# Patient Record
Sex: Female | Born: 1935
Health system: Southern US, Community
[De-identification: ages and names within clinical notes are randomized; demographics above are authoritative.]

## PROBLEM LIST (undated history)

## (undated) DIAGNOSIS — G479 Sleep disorder, unspecified: Secondary | ICD-10-CM

## (undated) DIAGNOSIS — M1990 Unspecified osteoarthritis, unspecified site: Secondary | ICD-10-CM

## (undated) DIAGNOSIS — K219 Gastro-esophageal reflux disease without esophagitis: Secondary | ICD-10-CM

## (undated) DIAGNOSIS — C4490 Unspecified malignant neoplasm of skin, unspecified: Secondary | ICD-10-CM

## (undated) DIAGNOSIS — E785 Hyperlipidemia, unspecified: Secondary | ICD-10-CM

## (undated) DIAGNOSIS — K5790 Diverticulosis of intestine, part unspecified, without perforation or abscess without bleeding: Secondary | ICD-10-CM

## (undated) DIAGNOSIS — J302 Other seasonal allergic rhinitis: Secondary | ICD-10-CM

## (undated) DIAGNOSIS — T8859XA Other complications of anesthesia, initial encounter: Secondary | ICD-10-CM

## (undated) DIAGNOSIS — M8580 Other specified disorders of bone density and structure, unspecified site: Secondary | ICD-10-CM

## (undated) DIAGNOSIS — T4145XA Adverse effect of unspecified anesthetic, initial encounter: Secondary | ICD-10-CM

## (undated) DIAGNOSIS — I10 Essential (primary) hypertension: Secondary | ICD-10-CM

## (undated) HISTORY — PX: CATARACT EXTRACTION W/ INTRAOCULAR LENS IMPLANT: SHX1309

## (undated) HISTORY — PX: SKIN CANCER EXCISION: SHX779

## (undated) HISTORY — PX: ABDOMINAL HYSTERECTOMY: SHX81

---

## 1989-05-14 HISTORY — PX: BILATERAL OOPHORECTOMY: SHX1221

## 1999-03-14 ENCOUNTER — Encounter: Payer: Self-pay | Admitting: Family Medicine

## 1999-03-14 ENCOUNTER — Encounter: Admission: RE | Admit: 1999-03-14 | Discharge: 1999-03-14 | Payer: Self-pay | Admitting: Family Medicine

## 2000-03-14 ENCOUNTER — Encounter: Payer: Self-pay | Admitting: Family Medicine

## 2000-03-14 ENCOUNTER — Encounter: Admission: RE | Admit: 2000-03-14 | Discharge: 2000-03-14 | Payer: Self-pay | Admitting: Family Medicine

## 2001-03-17 ENCOUNTER — Encounter: Payer: Self-pay | Admitting: Family Medicine

## 2001-03-17 ENCOUNTER — Encounter: Admission: RE | Admit: 2001-03-17 | Discharge: 2001-03-17 | Payer: Self-pay | Admitting: Family Medicine

## 2002-01-30 ENCOUNTER — Other Ambulatory Visit: Admission: RE | Admit: 2002-01-30 | Discharge: 2002-01-30 | Payer: Self-pay | Admitting: Family Medicine

## 2002-03-18 ENCOUNTER — Encounter: Payer: Self-pay | Admitting: Family Medicine

## 2002-03-18 ENCOUNTER — Encounter: Admission: RE | Admit: 2002-03-18 | Discharge: 2002-03-18 | Payer: Self-pay | Admitting: Family Medicine

## 2002-08-03 ENCOUNTER — Encounter: Payer: Self-pay | Admitting: Family Medicine

## 2002-08-03 ENCOUNTER — Encounter: Admission: RE | Admit: 2002-08-03 | Discharge: 2002-08-03 | Payer: Self-pay | Admitting: Family Medicine

## 2003-03-31 ENCOUNTER — Encounter: Admission: RE | Admit: 2003-03-31 | Discharge: 2003-03-31 | Payer: Self-pay | Admitting: Family Medicine

## 2003-04-23 ENCOUNTER — Ambulatory Visit (HOSPITAL_COMMUNITY): Admission: RE | Admit: 2003-04-23 | Discharge: 2003-04-23 | Payer: Self-pay | Admitting: Gastroenterology

## 2004-04-14 ENCOUNTER — Encounter: Admission: RE | Admit: 2004-04-14 | Discharge: 2004-04-14 | Payer: Self-pay | Admitting: Family Medicine

## 2005-04-27 ENCOUNTER — Encounter: Admission: RE | Admit: 2005-04-27 | Discharge: 2005-04-27 | Payer: Self-pay | Admitting: Family Medicine

## 2006-04-30 ENCOUNTER — Encounter: Admission: RE | Admit: 2006-04-30 | Discharge: 2006-04-30 | Payer: Self-pay | Admitting: Family Medicine

## 2007-05-12 ENCOUNTER — Encounter: Admission: RE | Admit: 2007-05-12 | Discharge: 2007-05-12 | Payer: Self-pay | Admitting: Family Medicine

## 2008-05-25 ENCOUNTER — Encounter: Admission: RE | Admit: 2008-05-25 | Discharge: 2008-05-25 | Payer: Self-pay | Admitting: Family Medicine

## 2009-05-30 ENCOUNTER — Encounter: Admission: RE | Admit: 2009-05-30 | Discharge: 2009-05-30 | Payer: Self-pay | Admitting: Family Medicine

## 2009-09-19 ENCOUNTER — Emergency Department (HOSPITAL_COMMUNITY)
Admission: EM | Admit: 2009-09-19 | Discharge: 2009-09-19 | Payer: Self-pay | Source: Home / Self Care | Admitting: Emergency Medicine

## 2010-08-14 ENCOUNTER — Other Ambulatory Visit: Payer: Self-pay | Admitting: Family Medicine

## 2010-08-14 DIAGNOSIS — Z1231 Encounter for screening mammogram for malignant neoplasm of breast: Secondary | ICD-10-CM

## 2010-08-29 ENCOUNTER — Ambulatory Visit
Admission: RE | Admit: 2010-08-29 | Discharge: 2010-08-29 | Disposition: A | Discharge status: Home / Self Care | Payer: Medicare Other | Source: Ambulatory Visit | Attending: Family Medicine | Admitting: Family Medicine

## 2010-08-29 DIAGNOSIS — Z1231 Encounter for screening mammogram for malignant neoplasm of breast: Secondary | ICD-10-CM

## 2011-07-31 ENCOUNTER — Other Ambulatory Visit: Payer: Self-pay | Admitting: Family Medicine

## 2011-07-31 DIAGNOSIS — Z1231 Encounter for screening mammogram for malignant neoplasm of breast: Secondary | ICD-10-CM

## 2011-09-04 ENCOUNTER — Ambulatory Visit
Admission: RE | Admit: 2011-09-04 | Discharge: 2011-09-04 | Disposition: A | Discharge status: Home / Self Care | Payer: BLUE CROSS/BLUE SHIELD | Source: Ambulatory Visit | Attending: Family Medicine | Admitting: Family Medicine

## 2011-09-04 DIAGNOSIS — Z1231 Encounter for screening mammogram for malignant neoplasm of breast: Secondary | ICD-10-CM

## 2011-10-04 DIAGNOSIS — M949 Disorder of cartilage, unspecified: Secondary | ICD-10-CM | POA: Diagnosis not present

## 2011-10-04 DIAGNOSIS — R42 Dizziness and giddiness: Secondary | ICD-10-CM | POA: Diagnosis not present

## 2011-10-04 DIAGNOSIS — I10 Essential (primary) hypertension: Secondary | ICD-10-CM | POA: Diagnosis not present

## 2011-10-04 DIAGNOSIS — Z1211 Encounter for screening for malignant neoplasm of colon: Secondary | ICD-10-CM | POA: Diagnosis not present

## 2011-10-04 DIAGNOSIS — E782 Mixed hyperlipidemia: Secondary | ICD-10-CM | POA: Diagnosis not present

## 2011-10-04 DIAGNOSIS — N9089 Other specified noninflammatory disorders of vulva and perineum: Secondary | ICD-10-CM | POA: Diagnosis not present

## 2011-10-04 DIAGNOSIS — Z0000 Encounter for general adult medical examination without abnormal findings: Secondary | ICD-10-CM | POA: Diagnosis not present

## 2011-10-04 DIAGNOSIS — M899 Disorder of bone, unspecified: Secondary | ICD-10-CM | POA: Diagnosis not present

## 2011-10-04 DIAGNOSIS — Z23 Encounter for immunization: Secondary | ICD-10-CM | POA: Diagnosis not present

## 2011-10-11 DIAGNOSIS — Z1211 Encounter for screening for malignant neoplasm of colon: Secondary | ICD-10-CM | POA: Diagnosis not present

## 2012-04-07 DIAGNOSIS — E782 Mixed hyperlipidemia: Secondary | ICD-10-CM | POA: Diagnosis not present

## 2012-08-27 ENCOUNTER — Other Ambulatory Visit: Payer: Self-pay

## 2012-08-27 DIAGNOSIS — Z1231 Encounter for screening mammogram for malignant neoplasm of breast: Secondary | ICD-10-CM

## 2012-10-07 ENCOUNTER — Ambulatory Visit
Admission: RE | Admit: 2012-10-07 | Discharge: 2012-10-07 | Disposition: A | Discharge status: Home / Self Care | Payer: Medicare Other | Source: Ambulatory Visit

## 2012-10-07 DIAGNOSIS — Z1231 Encounter for screening mammogram for malignant neoplasm of breast: Secondary | ICD-10-CM | POA: Diagnosis not present

## 2012-10-08 ENCOUNTER — Other Ambulatory Visit: Payer: Self-pay | Admitting: Family Medicine

## 2012-10-08 DIAGNOSIS — R928 Other abnormal and inconclusive findings on diagnostic imaging of breast: Secondary | ICD-10-CM

## 2012-10-16 ENCOUNTER — Ambulatory Visit
Admission: RE | Admit: 2012-10-16 | Discharge: 2012-10-16 | Disposition: A | Discharge status: Home / Self Care | Payer: Medicare Other | Source: Ambulatory Visit | Attending: Family Medicine | Admitting: Family Medicine

## 2012-10-16 DIAGNOSIS — R928 Other abnormal and inconclusive findings on diagnostic imaging of breast: Secondary | ICD-10-CM

## 2012-11-28 DIAGNOSIS — Z1331 Encounter for screening for depression: Secondary | ICD-10-CM | POA: Diagnosis not present

## 2012-11-28 DIAGNOSIS — M899 Disorder of bone, unspecified: Secondary | ICD-10-CM | POA: Diagnosis not present

## 2012-11-28 DIAGNOSIS — E782 Mixed hyperlipidemia: Secondary | ICD-10-CM | POA: Diagnosis not present

## 2012-11-28 DIAGNOSIS — I10 Essential (primary) hypertension: Secondary | ICD-10-CM | POA: Diagnosis not present

## 2012-11-28 DIAGNOSIS — Z0000 Encounter for general adult medical examination without abnormal findings: Secondary | ICD-10-CM | POA: Diagnosis not present

## 2012-11-28 DIAGNOSIS — Z1211 Encounter for screening for malignant neoplasm of colon: Secondary | ICD-10-CM | POA: Diagnosis not present

## 2012-11-28 DIAGNOSIS — R42 Dizziness and giddiness: Secondary | ICD-10-CM | POA: Diagnosis not present

## 2012-12-30 DIAGNOSIS — M899 Disorder of bone, unspecified: Secondary | ICD-10-CM | POA: Diagnosis not present

## 2013-02-19 DIAGNOSIS — L821 Other seborrheic keratosis: Secondary | ICD-10-CM | POA: Diagnosis not present

## 2013-02-19 DIAGNOSIS — L570 Actinic keratosis: Secondary | ICD-10-CM | POA: Diagnosis not present

## 2013-02-19 DIAGNOSIS — L989 Disorder of the skin and subcutaneous tissue, unspecified: Secondary | ICD-10-CM | POA: Diagnosis not present

## 2013-02-19 DIAGNOSIS — Z808 Family history of malignant neoplasm of other organs or systems: Secondary | ICD-10-CM | POA: Diagnosis not present

## 2013-03-09 ENCOUNTER — Other Ambulatory Visit: Payer: Self-pay | Admitting: Family Medicine

## 2013-03-09 DIAGNOSIS — R921 Mammographic calcification found on diagnostic imaging of breast: Secondary | ICD-10-CM

## 2013-04-14 ENCOUNTER — Ambulatory Visit
Admission: RE | Admit: 2013-04-14 | Discharge: 2013-04-14 | Disposition: A | Discharge status: Home / Self Care | Payer: Medicare Other | Source: Ambulatory Visit | Attending: Family Medicine | Admitting: Family Medicine

## 2013-04-14 DIAGNOSIS — R921 Mammographic calcification found on diagnostic imaging of breast: Secondary | ICD-10-CM

## 2013-04-14 DIAGNOSIS — R920 Mammographic microcalcification found on diagnostic imaging of breast: Secondary | ICD-10-CM | POA: Diagnosis not present

## 2013-05-11 DIAGNOSIS — K648 Other hemorrhoids: Secondary | ICD-10-CM | POA: Diagnosis not present

## 2013-05-11 DIAGNOSIS — Z1211 Encounter for screening for malignant neoplasm of colon: Secondary | ICD-10-CM | POA: Diagnosis not present

## 2013-05-11 DIAGNOSIS — K5730 Diverticulosis of large intestine without perforation or abscess without bleeding: Secondary | ICD-10-CM | POA: Diagnosis not present

## 2013-06-24 DIAGNOSIS — J029 Acute pharyngitis, unspecified: Secondary | ICD-10-CM | POA: Diagnosis not present

## 2013-06-24 DIAGNOSIS — J111 Influenza due to unidentified influenza virus with other respiratory manifestations: Secondary | ICD-10-CM | POA: Diagnosis not present

## 2013-09-09 ENCOUNTER — Other Ambulatory Visit: Payer: Self-pay | Admitting: Family Medicine

## 2013-09-09 DIAGNOSIS — R921 Mammographic calcification found on diagnostic imaging of breast: Secondary | ICD-10-CM

## 2013-10-20 ENCOUNTER — Ambulatory Visit
Admission: RE | Admit: 2013-10-20 | Discharge: 2013-10-20 | Disposition: A | Discharge status: Home / Self Care | Payer: Medicare Other | Source: Ambulatory Visit | Attending: Family Medicine | Admitting: Family Medicine

## 2013-10-20 DIAGNOSIS — H5210 Myopia, unspecified eye: Secondary | ICD-10-CM | POA: Diagnosis not present

## 2013-10-20 DIAGNOSIS — R928 Other abnormal and inconclusive findings on diagnostic imaging of breast: Secondary | ICD-10-CM | POA: Diagnosis not present

## 2013-10-20 DIAGNOSIS — H524 Presbyopia: Secondary | ICD-10-CM | POA: Diagnosis not present

## 2013-10-20 DIAGNOSIS — H25039 Anterior subcapsular polar age-related cataract, unspecified eye: Secondary | ICD-10-CM | POA: Diagnosis not present

## 2013-10-20 DIAGNOSIS — H52229 Regular astigmatism, unspecified eye: Secondary | ICD-10-CM | POA: Diagnosis not present

## 2013-10-20 DIAGNOSIS — R921 Mammographic calcification found on diagnostic imaging of breast: Secondary | ICD-10-CM

## 2013-12-15 ENCOUNTER — Ambulatory Visit
Admission: RE | Admit: 2013-12-15 | Discharge: 2013-12-15 | Disposition: A | Discharge status: Home / Self Care | Payer: Medicare Other | Source: Ambulatory Visit | Attending: Family Medicine | Admitting: Family Medicine

## 2013-12-15 ENCOUNTER — Other Ambulatory Visit: Payer: Self-pay | Admitting: Family Medicine

## 2013-12-15 DIAGNOSIS — M25551 Pain in right hip: Secondary | ICD-10-CM

## 2013-12-15 DIAGNOSIS — M79604 Pain in right leg: Secondary | ICD-10-CM

## 2013-12-15 DIAGNOSIS — M79609 Pain in unspecified limb: Secondary | ICD-10-CM | POA: Diagnosis not present

## 2013-12-15 DIAGNOSIS — M169 Osteoarthritis of hip, unspecified: Secondary | ICD-10-CM | POA: Diagnosis not present

## 2013-12-15 DIAGNOSIS — M1610 Unilateral primary osteoarthritis, unspecified hip: Secondary | ICD-10-CM | POA: Diagnosis not present

## 2014-01-06 DIAGNOSIS — M1610 Unilateral primary osteoarthritis, unspecified hip: Secondary | ICD-10-CM | POA: Diagnosis not present

## 2014-01-26 DIAGNOSIS — M1610 Unilateral primary osteoarthritis, unspecified hip: Secondary | ICD-10-CM | POA: Diagnosis not present

## 2014-03-16 DIAGNOSIS — Z23 Encounter for immunization: Secondary | ICD-10-CM | POA: Diagnosis not present

## 2014-03-16 DIAGNOSIS — R42 Dizziness and giddiness: Secondary | ICD-10-CM | POA: Diagnosis not present

## 2014-03-16 DIAGNOSIS — Z1389 Encounter for screening for other disorder: Secondary | ICD-10-CM | POA: Diagnosis not present

## 2014-03-16 DIAGNOSIS — K219 Gastro-esophageal reflux disease without esophagitis: Secondary | ICD-10-CM | POA: Diagnosis not present

## 2014-03-16 DIAGNOSIS — N183 Chronic kidney disease, stage 3 (moderate): Secondary | ICD-10-CM | POA: Diagnosis not present

## 2014-03-16 DIAGNOSIS — M8580 Other specified disorders of bone density and structure, unspecified site: Secondary | ICD-10-CM | POA: Diagnosis not present

## 2014-03-16 DIAGNOSIS — Z0000 Encounter for general adult medical examination without abnormal findings: Secondary | ICD-10-CM | POA: Diagnosis not present

## 2014-03-16 DIAGNOSIS — J301 Allergic rhinitis due to pollen: Secondary | ICD-10-CM | POA: Diagnosis not present

## 2014-03-16 DIAGNOSIS — E782 Mixed hyperlipidemia: Secondary | ICD-10-CM | POA: Diagnosis not present

## 2014-03-16 DIAGNOSIS — I129 Hypertensive chronic kidney disease with stage 1 through stage 4 chronic kidney disease, or unspecified chronic kidney disease: Secondary | ICD-10-CM | POA: Diagnosis not present

## 2014-03-18 DIAGNOSIS — M1611 Unilateral primary osteoarthritis, right hip: Secondary | ICD-10-CM | POA: Diagnosis not present

## 2014-03-24 DIAGNOSIS — D1801 Hemangioma of skin and subcutaneous tissue: Secondary | ICD-10-CM | POA: Diagnosis not present

## 2014-03-24 DIAGNOSIS — L814 Other melanin hyperpigmentation: Secondary | ICD-10-CM | POA: Diagnosis not present

## 2014-03-24 DIAGNOSIS — L821 Other seborrheic keratosis: Secondary | ICD-10-CM | POA: Diagnosis not present

## 2014-03-24 DIAGNOSIS — D239 Other benign neoplasm of skin, unspecified: Secondary | ICD-10-CM | POA: Diagnosis not present

## 2014-03-24 DIAGNOSIS — Z808 Family history of malignant neoplasm of other organs or systems: Secondary | ICD-10-CM | POA: Diagnosis not present

## 2014-06-03 ENCOUNTER — Ambulatory Visit: Payer: Self-pay | Admitting: Orthopedic Surgery

## 2014-06-03 NOTE — Progress Notes (Signed)
Preoperative surgical orders have been place into the [facility] hospital system for Desiree Caldwell on 06/03/2014, 5:35 PM  by Mickel Crow for surgery on 06-21-2014.  Preop Total Hip - Anterior Approach orders including Experel Injecion, IV Tylenol, and IV Decadron as long as there are no contraindications to the above medications. Arlee Muslim, PA-C

## 2014-06-11 NOTE — Patient Instructions (Addendum)
Desiree Caldwell  06/11/2014   Your procedure is scheduled on:  06/21/14    Report to [facility] Main  Entrance and follow signs to               Desiree Caldwell at 11:00 AM.   Call this number if you have problems the morning of surgery [phone]   Remember:  Do not eat food  :After Midnight.                         MAY HAVE CLEAR LIQUIDS UNTIL 8:00 AM    CLEAR LIQUID DIET   Foods Allowed                                                                     Foods Excluded  Coffee and tea, regular and decaf                             liquids that you cannot  Plain Jell-O in any flavor                                             see through such as: Fruit ices (not with fruit pulp)                                     milk, soups, orange juice  Iced Popsicles                                                   All solid food Carbonated beverages, regular and diet                                    Cranberry, grape and apple juices Sports drinks like Gatorade Lightly seasoned clear broth or consume(fat free) Sugar, honey syrup _____________________________________________________________________    Take these medicines the morning of surgery with A SIP OF WATER: PRILOSEC / FLONASE NASAL SPRAY                               You may not have any metal on your body including hair pins and              piercings  Do not wear jewelry, make-up, lotions, powders or perfumes.             Do not wear nail polish.  Do not shave  48 hours prior to surgery.              Men may shave face and neck.   Do not bring valuables to the hospital. Desiree Caldwell IS NOT  RESPONSIBLE   FOR VALUABLES.  Contacts, dentures or bridgework may not be worn into surgery.  Leave suitcase in the car. After surgery it may be brought to your room.     Patients discharged the day of surgery will not be allowed to drive home.  Name and phone number of your driver:  Special  Instructions: N/A              Please read over the following fact sheets you were given: _____________________________________________________________________                                                     Desiree Caldwell  Before surgery, you can play an important role.  Because skin is not sterile, your skin needs to be as free of germs as possible.  You can reduce the number of germs on your skin by washing with CHG (chlorahexidine gluconate) soap before surgery.  CHG is an antiseptic cleaner which kills germs and bonds with the skin to continue killing germs even after washing. Please DO NOT use if you have an allergy to CHG or antibacterial soaps.  If your skin becomes reddened/irritated stop using the CHG and inform your nurse when you arrive at Short Stay. Do not shave (including legs and underarms) for at least 48 hours prior to the first CHG shower.  You may shave your face. Please follow these instructions carefully:   1.  Shower with CHG Soap the night before surgery and the  morning of Surgery.   2.  If you choose to wash your hair, wash your hair first as usual with your  normal  Shampoo.   3.  After you shampoo, rinse your hair and body thoroughly to remove the  shampoo.                                         4.  Use CHG as you would any other liquid soap.  You can apply chg directly  to the skin and wash . Gently wash with scrungie or clean wascloth    5.  Apply the CHG Soap to your body ONLY FROM THE NECK DOWN.   Do not use on open                           Wound or open sores. Avoid contact with eyes, ears mouth and genitals (private parts).                        Genitals (private parts) with your normal soap.              6.  Wash thoroughly, paying special attention to the area where your surgery  will be performed.   7.  Thoroughly rinse your body with warm water from the neck down.   8.  DO NOT shower/wash with your normal soap after  using and rinsing off  the CHG Soap .                9.  Pat yourself dry with a clean towel.  10.  Wear clean pajamas.             11.  Place clean sheets on your bed the night of your first shower and do not  sleep with pets.  Day of Surgery : Do not apply any lotions/deodorants the morning of surgery.  Please wear clean clothes to the hospital/surgery center.  FAILURE TO FOLLOW THESE INSTRUCTIONS MAY RESULT IN THE CANCELLATION OF YOUR SURGERY    PATIENT SIGNATURE_________________________________  ______________________________________________________________________     Desiree Caldwell  An incentive spirometer is a tool that can help keep your lungs clear and active. This tool measures how well you are filling your lungs with each breath. Taking long deep breaths may help reverse or decrease the chance of developing breathing (pulmonary) problems (especially infection) following:  A long period of time when you are unable to move or be active. BEFORE THE PROCEDURE   If the spirometer includes an indicator to show your best effort, your nurse or respiratory therapist will set it to a desired goal.  If possible, sit up straight or lean slightly forward. Try not to slouch.  Hold the incentive spirometer in an upright position. INSTRUCTIONS FOR USE   Sit on the edge of your bed if possible, or sit up as far as you can in bed or on a chair.  Hold the incentive spirometer in an upright position.  Breathe out normally.  Place the mouthpiece in your mouth and seal your lips tightly around it.  Breathe in slowly and as deeply as possible, raising the piston or the ball toward the top of the column.  Hold your breath for 3-5 seconds or for as long as possible. Allow the piston or ball to fall to the bottom of the column.  Remove the mouthpiece from your mouth and breathe out normally.  Rest for a few seconds and repeat Steps 1 through 7 at least 10 times  every 1-2 hours when you are awake. Take your time and take a few normal breaths between deep breaths.  The spirometer may include an indicator to show your best effort. Use the indicator as a goal to work toward during each repetition.  After each set of 10 deep breaths, practice coughing to be sure your lungs are clear. If you have an incision (the cut made at the time of surgery), support your incision when coughing by placing a pillow or rolled up towels firmly against it. Once you are able to get out of bed, walk around indoors and cough well. You may stop using the incentive spirometer when instructed by your caregiver.  RISKS AND COMPLICATIONS  Take your time so you do not get dizzy or light-headed.  If you are in pain, you may need to take or ask for pain medication before doing incentive spirometry. It is harder to take a deep breath if you are having pain. AFTER USE  Rest and breathe slowly and easily.  It can be helpful to keep track of a log of your progress. Your caregiver can provide you with a simple table to help with this. If you are using the spirometer at home, follow these instructions: [place] IF:   You are having difficultly using the spirometer.  You have trouble using the spirometer as often as instructed.  Your pain medication is not giving enough relief while using the spirometer.  You develop fever of 100.5 F (38.1 C) or higher. SEEK IMMEDIATE MEDICAL CARE IF:   You  cough up bloody sputum that had not been present before.  You develop fever of 102 F (38.9 C) or greater.  You develop worsening pain at or near the incision site. MAKE SURE YOU:   Understand these instructions.  Will watch your condition.  Will get help right away if you are not doing well or get worse. Document Released: 09/10/2006 Document Revised: 07/23/2011 Document Reviewed: 11/11/2006 Exit[employer] Patient Information 2014 ExitCare,  [place].   ________________________________________________________________________  WHAT IS A BLOOD TRANSFUSION? Blood Transfusion Information  A transfusion is the replacement of blood or some of its parts. Blood is made up of multiple cells which provide different functions.  Red blood cells carry oxygen and are used for blood loss replacement.  White blood cells fight against infection.  Platelets control bleeding.  Plasma helps clot blood.  Other blood products are available for specialized needs, such as hemophilia or other clotting disorders. BEFORE THE TRANSFUSION  Who gives blood for transfusions?   Healthy volunteers who are fully evaluated to make sure their blood is safe. This is blood bank blood. Transfusion therapy is the safest it has ever been in the practice of medicine. Before blood is taken from a donor, a complete history is taken to make sure that person has no history of diseases nor engages in risky social behavior (examples are intravenous drug use or sexual activity with multiple partners). The donor's travel history is screened to minimize risk of transmitting infections, such as malaria. The donated blood is tested for signs of infectious diseases, such as HIV and hepatitis. The blood is then tested to be sure it is compatible with you in order to minimize the chance of a transfusion reaction. If you or a relative donates blood, this is often done in anticipation of surgery and is not appropriate for emergency situations. It takes many days to process the donated blood. RISKS AND COMPLICATIONS Although transfusion therapy is very safe and saves many lives, the main dangers of transfusion include:   Getting an infectious disease.  Developing a transfusion reaction. This is an allergic reaction to something in the blood you were given. Every precaution is taken to prevent this. The decision to have a blood transfusion has been considered carefully by your caregiver  before blood is given. Blood is not given unless the benefits outweigh the risks. AFTER THE TRANSFUSION  Right after receiving a blood transfusion, you will usually feel much better and more energetic. This is especially true if your red blood cells have gotten low (anemic). The transfusion raises the level of the red blood cells which carry oxygen, and this usually causes an energy increase.  The nurse administering the transfusion will monitor you carefully for complications. HOME CARE INSTRUCTIONS  No special instructions are needed after a transfusion. You may find your energy is better. Speak with your caregiver about any limitations on activity for underlying diseases you may have. SEEK MEDICAL CARE IF:   Your condition is not improving after your transfusion.  You develop redness or irritation at the intravenous (IV) site. SEEK IMMEDIATE MEDICAL CARE IF:  Any of the following symptoms occur over the next 12 hours:  Shaking chills.  You have a temperature by mouth above 102 F (38.9 C), not controlled by medicine.  Chest, back, or muscle pain.  People around you feel you are not acting correctly or are confused.  Shortness of breath or difficulty breathing.  Dizziness and fainting.  You get a rash or develop hives.  You have a decrease in urine output.  Your urine turns a dark color or changes to pink, red, or brown. Any of the following symptoms occur over the next 10 days:  You have a temperature by mouth above 102 F (38.9 C), not controlled by medicine.  Shortness of breath.  Weakness after normal activity.  The white part of the eye turns yellow (jaundice).  You have a decrease in the amount of urine or are urinating less often.  Your urine turns a dark color or changes to pink, red, or brown. Document Released: 04/27/2000 Document Revised: 07/23/2011 Document Reviewed: 12/15/2007 [facility] Patient Information 2014 [place],  [place].  _______________________________________________________________________

## 2014-06-14 ENCOUNTER — Ambulatory Visit (HOSPITAL_COMMUNITY)
Admission: RE | Admit: 2014-06-14 | Discharge: 2014-06-14 | Disposition: A | Discharge status: Home / Self Care | Payer: Medicare Other | Source: Ambulatory Visit | Attending: Anesthesiology | Admitting: Anesthesiology

## 2014-06-14 ENCOUNTER — Other Ambulatory Visit: Payer: Self-pay

## 2014-06-14 ENCOUNTER — Encounter (HOSPITAL_COMMUNITY)
Admission: RE | Admit: 2014-06-14 | Discharge: 2014-06-14 | Disposition: A | Discharge status: Home / Self Care | Payer: Medicare Other | Source: Ambulatory Visit | Attending: Orthopedic Surgery | Admitting: Orthopedic Surgery

## 2014-06-14 ENCOUNTER — Encounter (HOSPITAL_COMMUNITY): Payer: Self-pay

## 2014-06-14 DIAGNOSIS — E780 Pure hypercholesterolemia: Secondary | ICD-10-CM | POA: Diagnosis not present

## 2014-06-14 DIAGNOSIS — Z0183 Encounter for blood typing: Secondary | ICD-10-CM | POA: Insufficient documentation

## 2014-06-14 DIAGNOSIS — M1611 Unilateral primary osteoarthritis, right hip: Secondary | ICD-10-CM | POA: Diagnosis not present

## 2014-06-14 DIAGNOSIS — Z01812 Encounter for preprocedural laboratory examination: Secondary | ICD-10-CM | POA: Diagnosis not present

## 2014-06-14 DIAGNOSIS — K449 Diaphragmatic hernia without obstruction or gangrene: Secondary | ICD-10-CM | POA: Diagnosis not present

## 2014-06-14 DIAGNOSIS — Z7982 Long term (current) use of aspirin: Secondary | ICD-10-CM | POA: Diagnosis not present

## 2014-06-14 DIAGNOSIS — I10 Essential (primary) hypertension: Secondary | ICD-10-CM | POA: Diagnosis not present

## 2014-06-14 DIAGNOSIS — Z79899 Other long term (current) drug therapy: Secondary | ICD-10-CM | POA: Diagnosis not present

## 2014-06-14 DIAGNOSIS — Z01818 Encounter for other preprocedural examination: Secondary | ICD-10-CM | POA: Insufficient documentation

## 2014-06-14 HISTORY — DX: Gastro-esophageal reflux disease without esophagitis: K21.9

## 2014-06-14 HISTORY — DX: Other complications of anesthesia, initial encounter: T88.59XA

## 2014-06-14 HISTORY — DX: Other seasonal allergic rhinitis: J30.2

## 2014-06-14 HISTORY — DX: Essential (primary) hypertension: I10

## 2014-06-14 HISTORY — DX: Sleep disorder, unspecified: G47.9

## 2014-06-14 HISTORY — DX: Hyperlipidemia, unspecified: E78.5

## 2014-06-14 HISTORY — DX: Other specified disorders of bone density and structure, unspecified site: M85.80

## 2014-06-14 HISTORY — DX: Diverticulosis of intestine, part unspecified, without perforation or abscess without bleeding: K57.90

## 2014-06-14 HISTORY — DX: Adverse effect of unspecified anesthetic, initial encounter: T41.45XA

## 2014-06-14 HISTORY — DX: Unspecified osteoarthritis, unspecified site: M19.90

## 2014-06-14 LAB — URINALYSIS, ROUTINE W REFLEX MICROSCOPIC
Bilirubin Urine: NEGATIVE
GLUCOSE, UA: NEGATIVE mg/dL
Hgb urine dipstick: NEGATIVE
KETONES UR: NEGATIVE mg/dL
LEUKOCYTES UA: NEGATIVE
NITRITE: NEGATIVE
PROTEIN: NEGATIVE mg/dL
Specific Gravity, Urine: 1.017 (ref 1.005–1.030)
UROBILINOGEN UA: 0.2 mg/dL (ref 0.0–1.0)
pH: 5 (ref 5.0–8.0)

## 2014-06-14 LAB — COMPREHENSIVE METABOLIC PANEL
ALT: 16 U/L (ref 0–35)
ANION GAP: 8 (ref 5–15)
AST: 20 U/L (ref 0–37)
Albumin: 4.2 g/dL (ref 3.5–5.2)
Alkaline Phosphatase: 64 U/L (ref 39–117)
BUN: 18 mg/dL (ref 6–23)
CO2: 29 mmol/L (ref 19–32)
Calcium: 9.7 mg/dL (ref 8.4–10.5)
Chloride: 104 mmol/L (ref 96–112)
Creatinine, Ser: 1.24 mg/dL — ABNORMAL HIGH (ref 0.50–1.10)
GFR calc Af Amer: 47 mL/min — ABNORMAL LOW (ref >90)
GFR calc non Af Amer: 41 mL/min — ABNORMAL LOW (ref >90)
Glucose, Bld: 102 mg/dL — ABNORMAL HIGH (ref 70–99)
Potassium: 4.2 mmol/L (ref 3.5–5.1)
Sodium: 141 mmol/L (ref 135–145)
Total Bilirubin: 0.5 mg/dL (ref 0.3–1.2)
Total Protein: 7.7 g/dL (ref 6.0–8.3)

## 2014-06-14 LAB — PROTIME-INR
INR: 0.96 (ref 0.00–1.49)
PROTHROMBIN TIME: 12.9 s (ref 11.6–15.2)

## 2014-06-14 LAB — SURGICAL PCR SCREEN
MRSA, PCR: NEGATIVE
Staphylococcus aureus: NEGATIVE

## 2014-06-14 LAB — APTT: aPTT: 35 seconds (ref 24–37)

## 2014-06-14 LAB — CBC
HEMATOCRIT: 38.5 % (ref 36.0–46.0)
Hemoglobin: 12.6 g/dL (ref 12.0–15.0)
MCH: 27.3 pg (ref 26.0–34.0)
MCHC: 32.7 g/dL (ref 30.0–36.0)
MCV: 83.5 fL (ref 78.0–100.0)
Platelets: 289 10*3/uL (ref 150–400)
RBC: 4.61 MIL/uL (ref 3.87–5.11)
RDW: 15.2 % (ref 11.5–15.5)
WBC: 7.3 10*3/uL (ref 4.0–10.5)

## 2014-06-14 LAB — ABO/RH: ABO/RH(D): O POS

## 2014-06-14 NOTE — Progress Notes (Signed)
CXR and CMP results from 06/14/2014 faxed via  EPIC to Dr Wynelle Link.

## 2014-06-15 ENCOUNTER — Ambulatory Visit: Payer: Self-pay | Admitting: Orthopedic Surgery

## 2014-06-15 NOTE — H&P (Signed)
Desiree Caldwell DOB: 1933/12/27 Single / Language: Cleophus Molt / Race: White Female Date of Admission:  06/21/2013 CC:  Right Hip Pain History of Present Illness The patient is a 81 year old female who comes in for a preoperative History and Physical. The patient is scheduled for a right total hip arthroplasty (anterior approach) to be performed by Dr. Dione Plover. Aluisio, MD at [facility] on 08-04-2014. The patient is a 81 year old female who presents today for follow up of their hip. The patient is being followed for their right hip pain and osteoarthritis. They are a couple of months out from IA injection with Dr.Kendall. Symptoms reported today include: pain. The patient feels that they are doing poorly. Note for "Follow-up Hip": Patient states that the injection lasted two weeks. She is having trouble sleeping. She is taking Aleve daily but states that she is ready to have something done to relieve the pain. Her right hip is hurting her at all times. She has had problems for close to a year now. She has pain with activity as well as at rest. It is limiting what she can and cannot do. She had an intraarticular injection which did not help. She is now having pain at night. She has advanced end-stage arthritis of the right hip with progressively worsening pain and dysfunction. At this point, the most predictable means of improving pain and function is total hip arthroplasty. She is ready to proceed with surgery. They have been treated conservatively in the past for the above stated problem and despite conservative measures, they continue to have progressive pain and severe functional limitations and dysfunction. They have failed non-operative management including home exercise, medications, and injections. It is felt that they would benefit from undergoing total joint replacement. Risks and benefits of the procedure have been discussed with the patient and they elect to proceed with surgery. There are no  active contraindications to surgery such as ongoing infection or rapidly progressive neurological disease.  Problem List/Past Medical  Primary localized osteoarthritis of right hip (M16.11) Osteoarthritis of right hip, unspecified osteoarthritis type (M16.11) High blood pressure Hypercholesterolemia Vertigo Diverticulosis  Allergies No Known Drug Allergies  Family History  Drug / Alcohol Addiction father Severe allergy sister First Degree Relatives  Social History  Drug/Alcohol Rehab (Currently) no Tobacco use never smoker Pain Contract no Illicit drug use no Marital status single Living situation live alone Number of flights of stairs before winded less than 1 Exercise Exercises weekly; does other Drug/Alcohol Rehab (Previously) no Alcohol use current drinker; less than 5 per week Current work status retired [employer]  Medication History  Hydrochlorothiazide (25MG  Tablet, Oral) Active. Fluticasone Propionate (50MCG/ACT Suspension, Nasal) Active. PriLOSEC OTC (20MG  Tablet DR, Oral) Active. Simvastatin (80MG  Tablet, Oral) Active. Aspirin EC (81MG  Tablet DR, Oral) Active.   Past Surgical History Hysterectomy partial (non-cancerous) Tubal Ligation  Review of Systems General Not Present- Chills, Fatigue, Fever, Memory Loss, Night Sweats, Weight Gain and Weight Loss. Skin Not Present- Eczema, Hives, Itching, Lesions and Rash. HEENT Not Present- Dentures, Double Vision, Headache, Hearing Loss, Tinnitus and Visual Loss. Respiratory Not Present- Allergies, Chronic Cough, Coughing up blood, Shortness of breath at rest and Shortness of breath with exertion. Cardiovascular Not Present- Chest Pain, Difficulty Breathing Lying Down, Murmur, Palpitations, Racing/skipping heartbeats and Swelling. Gastrointestinal Not Present- Abdominal Pain, Bloody Stool, Constipation,  Diarrhea, Difficulty Swallowing, Heartburn, Jaundice, Loss of appetitie, Nausea and Vomiting. Female Genitourinary Not  Present- Blood in Urine, Discharge, Flank Pain, Incontinence, Painful Urination, Urgency, Urinary frequency, Urinary Retention, Urinating at Night and Weak urinary stream. Musculoskeletal Present- Joint Pain. Not Present- Back Pain, Joint Swelling, Morning Stiffness, Muscle Pain, Muscle Weakness and Spasms. Neurological Not Present- Blackout spells, Difficulty with balance, Dizziness, Paralysis, Tremor and Weakness. Psychiatric Not Present- Insomnia.  Vitals  Weight: 150 lb Height: 65in Weight was reported by patient. Height was reported by patient. Body Surface Area: 1.75 m Body Mass Index: 24.96 kg/m  BP: 152/88 (Sitting, Right Arm, Standard)   Physical Exam  General Mental Status -Alert, cooperative and good historian. General Appearance-pleasant, Not in acute distress. Orientation-Oriented X3. Build & Nutrition-Well nourished and Well developed.  Head and Neck Head-normocephalic, atraumatic . Neck Global Assessment - supple, no bruit auscultated on the right, no bruit auscultated on the left.  Eye Vision-Wears contact lenses. Pupil - Bilateral-Regular and Round. Motion - Bilateral-EOMI.  Chest and Lung Exam Auscultation Breath sounds - clear at anterior chest wall and clear at posterior chest wall. Adventitious sounds - No Adventitious sounds.  Cardiovascular Auscultation Rhythm - Regular rate and rhythm. Heart Sounds - S1 WNL and S2 WNL. Murmurs & Other Heart Sounds - Auscultation of the heart reveals - No Murmurs.  Abdomen Palpation/Percussion Tenderness - Abdomen is non-tender to palpation. Rigidity (guarding) - Abdomen is soft. Auscultation Auscultation of the abdomen reveals - Bowel sounds normal.  Female Genitourinary Note: Not done, not pertinent to present illness   Musculoskeletal Note: On examination,  well-developed female alert and oriented in no apparent distress. Evaluation of her left hip shows normal range of motion with no discomfort. Her right hip flexion to 90, no internal rotation, about 10 degrees of external rotation, about 20-30 degrees of abduction. She has pain on range of motion of the hip. Her knee examination is normal. Pulse, sensation and motor are intact. She has an antalgic gait pattern.  RADIOGRAPHS: X-rays AP pelvis and lateral of the right hip show that she has bone-on-bone arthritis with some subchondral cyst formation. The left appears normal.   Assessment & Plan  Primary localized osteoarthritis of right hip (M16.11) Note:Plan is for a Right Total Hip Replacement - anterior approach by Dr. Wynelle Link.  Plan is to go to [facility].  PCP - Dr. Derrill Memo - Patient has been seen preoperatively and felt to be stable for surgery.  The patient does not have any contraindications and will receive TXA (tranexamic acid) prior to surgery.  Signed electronically by Joelene Millin, III PA-C

## 2014-06-17 NOTE — Progress Notes (Signed)
Called patient at [phone] and Left messsage regarding time change of surgery to 1135am-105pm and to arrive at 0830am.  Asked patient on message to call back to verify patient had received message.

## 2014-06-18 NOTE — Progress Notes (Signed)
Patient received message regarding time change of surgery on 06/21/2014.  Will arrive in Short Stay agt 0830am.

## 2014-06-21 ENCOUNTER — Inpatient Hospital Stay (HOSPITAL_COMMUNITY): Payer: Medicare Other

## 2014-06-21 ENCOUNTER — Encounter (HOSPITAL_COMMUNITY)
Admission: RE | Disposition: A | Discharge status: Skilled Nursing Facility | Payer: Self-pay | Source: Ambulatory Visit | Attending: Orthopedic Surgery

## 2014-06-21 ENCOUNTER — Inpatient Hospital Stay (HOSPITAL_COMMUNITY): Payer: Medicare Other | Admitting: Certified Registered Nurse Anesthetist

## 2014-06-21 ENCOUNTER — Encounter (HOSPITAL_COMMUNITY): Payer: Self-pay | Admitting: *Deleted

## 2014-06-21 ENCOUNTER — Inpatient Hospital Stay (HOSPITAL_COMMUNITY)
Admission: RE | Admit: 2014-06-21 | Discharge: 2014-06-23 | DRG: 470 | Disposition: A | Discharge status: Skilled Nursing Facility | Payer: Medicare Other | Source: Ambulatory Visit | Attending: Orthopedic Surgery | Admitting: Orthopedic Surgery

## 2014-06-21 DIAGNOSIS — R2681 Unsteadiness on feet: Secondary | ICD-10-CM | POA: Diagnosis not present

## 2014-06-21 DIAGNOSIS — Z96649 Presence of unspecified artificial hip joint: Secondary | ICD-10-CM

## 2014-06-21 DIAGNOSIS — Z7982 Long term (current) use of aspirin: Secondary | ICD-10-CM | POA: Diagnosis not present

## 2014-06-21 DIAGNOSIS — M1611 Unilateral primary osteoarthritis, right hip: Principal | ICD-10-CM | POA: Diagnosis present

## 2014-06-21 DIAGNOSIS — K449 Diaphragmatic hernia without obstruction or gangrene: Secondary | ICD-10-CM | POA: Diagnosis present

## 2014-06-21 DIAGNOSIS — E785 Hyperlipidemia, unspecified: Secondary | ICD-10-CM | POA: Diagnosis present

## 2014-06-21 DIAGNOSIS — I10 Essential (primary) hypertension: Secondary | ICD-10-CM | POA: Diagnosis present

## 2014-06-21 DIAGNOSIS — R531 Weakness: Secondary | ICD-10-CM | POA: Diagnosis not present

## 2014-06-21 DIAGNOSIS — M6281 Muscle weakness (generalized): Secondary | ICD-10-CM | POA: Diagnosis not present

## 2014-06-21 DIAGNOSIS — K219 Gastro-esophageal reflux disease without esophagitis: Secondary | ICD-10-CM | POA: Diagnosis present

## 2014-06-21 DIAGNOSIS — R278 Other lack of coordination: Secondary | ICD-10-CM | POA: Diagnosis not present

## 2014-06-21 DIAGNOSIS — E780 Pure hypercholesterolemia: Secondary | ICD-10-CM | POA: Diagnosis present

## 2014-06-21 DIAGNOSIS — Z96641 Presence of right artificial hip joint: Secondary | ICD-10-CM | POA: Diagnosis not present

## 2014-06-21 DIAGNOSIS — M25551 Pain in right hip: Secondary | ICD-10-CM | POA: Diagnosis not present

## 2014-06-21 DIAGNOSIS — Z79899 Other long term (current) drug therapy: Secondary | ICD-10-CM | POA: Diagnosis not present

## 2014-06-21 DIAGNOSIS — M8580 Other specified disorders of bone density and structure, unspecified site: Secondary | ICD-10-CM | POA: Diagnosis not present

## 2014-06-21 DIAGNOSIS — M169 Osteoarthritis of hip, unspecified: Secondary | ICD-10-CM | POA: Diagnosis not present

## 2014-06-21 DIAGNOSIS — Z471 Aftercare following joint replacement surgery: Secondary | ICD-10-CM | POA: Diagnosis not present

## 2014-06-21 HISTORY — PX: TOTAL HIP ARTHROPLASTY: SHX124

## 2014-06-21 LAB — TYPE AND SCREEN
ABO/RH(D): O POS
ANTIBODY SCREEN: NEGATIVE

## 2014-06-21 SURGERY — ARTHROPLASTY, HIP, TOTAL, ANTERIOR APPROACH
Anesthesia: Monitor Anesthesia Care | Site: Hip | Laterality: Right

## 2014-06-21 MED ORDER — METOCLOPRAMIDE HCL 5 MG/ML IJ SOLN: 5.0000 mg | Freq: Three times a day (TID) | INTRAMUSCULAR | Status: DC | PRN

## 2014-06-21 MED ORDER — FLEET ENEMA 7-19 GM/118ML RE ENEM: 1.0000 | ENEMA | Freq: Once | RECTAL | Status: AC | PRN

## 2014-06-21 MED ORDER — BUPIVACAINE LIPOSOME 1.3 % IJ SUSP
INTRAMUSCULAR | Status: DC | PRN
  Administered 2014-06-21: 20 mL

## 2014-06-21 MED ORDER — KETOROLAC TROMETHAMINE 15 MG/ML IJ SOLN
INTRAMUSCULAR | Status: AC
  Filled 2014-06-21: qty 1

## 2014-06-21 MED ORDER — FENTANYL CITRATE 0.05 MG/ML IJ SOLN
INTRAMUSCULAR | Status: AC
Start: 2014-06-21 — End: 2014-06-21
  Filled 2014-06-21: qty 2

## 2014-06-21 MED ORDER — ACETAMINOPHEN 325 MG PO TABS
650.0000 mg | ORAL_TABLET | Freq: Four times a day (QID) | ORAL | Status: DC | PRN
  Administered 2014-06-23: 650 mg via ORAL
  Filled 2014-06-21: qty 2

## 2014-06-21 MED ORDER — PROPOFOL INFUSION 10 MG/ML OPTIME
INTRAVENOUS | Status: DC | PRN
  Administered 2014-06-21: 25 ug/kg/min via INTRAVENOUS

## 2014-06-21 MED ORDER — OXYCODONE HCL 5 MG PO TABS: 5.0000 mg | ORAL_TABLET | Freq: Once | ORAL | Status: DC | PRN

## 2014-06-21 MED ORDER — BUPIVACAINE LIPOSOME 1.3 % IJ SUSP
20.0000 mL | Freq: Once | INTRAMUSCULAR | Status: DC
  Filled 2014-06-21: qty 20

## 2014-06-21 MED ORDER — PROPOFOL 10 MG/ML IV BOLUS
INTRAVENOUS | Status: DC | PRN
  Administered 2014-06-21 (×5): 10 mg via INTRAVENOUS

## 2014-06-21 MED ORDER — MENTHOL 3 MG MT LOZG
1.0000 | LOZENGE | OROMUCOSAL | Status: DC | PRN
  Filled 2014-06-21: qty 9

## 2014-06-21 MED ORDER — FENTANYL CITRATE 0.05 MG/ML IJ SOLN
INTRAMUSCULAR | Status: DC | PRN
  Administered 2014-06-21: 50 ug via INTRAVENOUS
  Administered 2014-06-21 (×4): 25 ug via INTRAVENOUS

## 2014-06-21 MED ORDER — OXYCODONE HCL 5 MG/5ML PO SOLN
5.0000 mg | Freq: Once | ORAL | Status: DC | PRN
  Filled 2014-06-21: qty 5

## 2014-06-21 MED ORDER — METOCLOPRAMIDE HCL 10 MG PO TABS: 5.0000 mg | ORAL_TABLET | Freq: Three times a day (TID) | ORAL | Status: DC | PRN

## 2014-06-21 MED ORDER — HYDROMORPHONE HCL 1 MG/ML IJ SOLN: 0.2500 mg | INTRAMUSCULAR | Status: DC | PRN

## 2014-06-21 MED ORDER — DEXAMETHASONE SODIUM PHOSPHATE 10 MG/ML IJ SOLN
10.0000 mg | Freq: Once | INTRAMUSCULAR | Status: AC
  Administered 2014-06-21: 10 mg via INTRAVENOUS

## 2014-06-21 MED ORDER — MORPHINE SULFATE 2 MG/ML IJ SOLN: 1.0000 mg | INTRAMUSCULAR | Status: DC | PRN

## 2014-06-21 MED ORDER — MIDAZOLAM HCL 2 MG/2ML IJ SOLN
INTRAMUSCULAR | Status: AC
  Filled 2014-06-21: qty 2

## 2014-06-21 MED ORDER — ACETAMINOPHEN 10 MG/ML IV SOLN
1000.0000 mg | Freq: Once | INTRAVENOUS | Status: AC
  Administered 2014-06-21: 1000 mg via INTRAVENOUS
  Filled 2014-06-21: qty 100

## 2014-06-21 MED ORDER — LACTATED RINGERS IV SOLN
INTRAVENOUS | Status: DC
  Administered 2014-06-21: 13:00:00 via INTRAVENOUS
  Administered 2014-06-21: 1000 mL via INTRAVENOUS

## 2014-06-21 MED ORDER — STERILE WATER FOR IRRIGATION IR SOLN
Status: DC | PRN
  Administered 2014-06-21: 1500 mL

## 2014-06-21 MED ORDER — ONDANSETRON HCL 4 MG/2ML IJ SOLN
INTRAMUSCULAR | Status: DC | PRN
Start: 2014-06-21 — End: 2014-06-21
  Administered 2014-06-21: 4 mg via INTRAVENOUS

## 2014-06-21 MED ORDER — CEFAZOLIN SODIUM-DEXTROSE 2-3 GM-% IV SOLR
2.0000 g | Freq: Four times a day (QID) | INTRAVENOUS | Status: AC
  Administered 2014-06-21 (×2): 2 g via INTRAVENOUS
  Filled 2014-06-21 (×2): qty 50

## 2014-06-21 MED ORDER — DIPHENHYDRAMINE HCL 12.5 MG/5ML PO ELIX: 12.5000 mg | ORAL_SOLUTION | ORAL | Status: DC | PRN

## 2014-06-21 MED ORDER — SODIUM CHLORIDE 0.9 % IJ SOLN
INTRAMUSCULAR | Status: DC | PRN
  Administered 2014-06-21: 30 mL

## 2014-06-21 MED ORDER — MIDAZOLAM HCL 5 MG/5ML IJ SOLN
INTRAMUSCULAR | Status: DC | PRN
  Administered 2014-06-21 (×2): 1 mg via INTRAVENOUS

## 2014-06-21 MED ORDER — KETOROLAC TROMETHAMINE 15 MG/ML IJ SOLN
7.5000 mg | Freq: Four times a day (QID) | INTRAMUSCULAR | Status: AC | PRN
  Administered 2014-06-21: 7.5 mg via INTRAVENOUS

## 2014-06-21 MED ORDER — SODIUM CHLORIDE 0.9 % IV SOLN: INTRAVENOUS | Status: DC

## 2014-06-21 MED ORDER — POLYETHYLENE GLYCOL 3350 17 G PO PACK: 17.0000 g | PACK | Freq: Every day | ORAL | Status: DC | PRN

## 2014-06-21 MED ORDER — BUPIVACAINE HCL (PF) 0.25 % IJ SOLN
INTRAMUSCULAR | Status: AC
  Filled 2014-06-21: qty 30

## 2014-06-21 MED ORDER — PROMETHAZINE HCL 25 MG/ML IJ SOLN: 6.2500 mg | INTRAMUSCULAR | Status: DC | PRN

## 2014-06-21 MED ORDER — HYDROCHLOROTHIAZIDE 25 MG PO TABS
12.5000 mg | ORAL_TABLET | Freq: Every morning | ORAL | Status: DC
  Filled 2014-06-21 (×2): qty 0.5

## 2014-06-21 MED ORDER — METHOCARBAMOL 500 MG PO TABS
500.0000 mg | ORAL_TABLET | Freq: Four times a day (QID) | ORAL | Status: DC | PRN
  Administered 2014-06-21 – 2014-06-23 (×3): 500 mg via ORAL
  Filled 2014-06-21 (×3): qty 1

## 2014-06-21 MED ORDER — BISACODYL 10 MG RE SUPP: 10.0000 mg | Freq: Every day | RECTAL | Status: DC | PRN

## 2014-06-21 MED ORDER — PHENYLEPHRINE HCL 10 MG/ML IJ SOLN
INTRAMUSCULAR | Status: DC | PRN
  Administered 2014-06-21 (×3): 40 ug via INTRAVENOUS
  Administered 2014-06-21: 80 ug via INTRAVENOUS

## 2014-06-21 MED ORDER — TRANEXAMIC ACID 100 MG/ML IV SOLN
1000.0000 mg | INTRAVENOUS | Status: AC
  Administered 2014-06-21: 1000 mg via INTRAVENOUS
  Filled 2014-06-21: qty 10

## 2014-06-21 MED ORDER — PROPOFOL 10 MG/ML IV BOLUS
INTRAVENOUS | Status: AC
  Filled 2014-06-21: qty 20

## 2014-06-21 MED ORDER — ACETAMINOPHEN 650 MG RE SUPP
650.0000 mg | Freq: Four times a day (QID) | RECTAL | Status: DC | PRN
Start: 2014-06-22 — End: 2014-06-23

## 2014-06-21 MED ORDER — RIVAROXABAN 10 MG PO TABS
10.0000 mg | ORAL_TABLET | Freq: Every day | ORAL | Status: DC
  Administered 2014-06-22 – 2014-06-23 (×2): 10 mg via ORAL
  Filled 2014-06-21 (×3): qty 1

## 2014-06-21 MED ORDER — DEXAMETHASONE SODIUM PHOSPHATE 10 MG/ML IJ SOLN
10.0000 mg | Freq: Once | INTRAMUSCULAR | Status: AC
  Administered 2014-06-22: 10 mg via INTRAVENOUS
  Filled 2014-06-21 (×2): qty 1

## 2014-06-21 MED ORDER — DEXAMETHASONE SODIUM PHOSPHATE 10 MG/ML IJ SOLN
INTRAMUSCULAR | Status: AC
  Filled 2014-06-21: qty 1

## 2014-06-21 MED ORDER — FLUTICASONE PROPIONATE 50 MCG/ACT NA SUSP
2.0000 | Freq: Every morning | NASAL | Status: DC
  Administered 2014-06-22 – 2014-06-23 (×2): 2 via NASAL
  Filled 2014-06-21: qty 16

## 2014-06-21 MED ORDER — CEFAZOLIN SODIUM-DEXTROSE 2-3 GM-% IV SOLR
INTRAVENOUS | Status: AC
  Filled 2014-06-21: qty 50

## 2014-06-21 MED ORDER — ACETAMINOPHEN 500 MG PO TABS
1000.0000 mg | ORAL_TABLET | Freq: Four times a day (QID) | ORAL | Status: AC
  Administered 2014-06-21 – 2014-06-22 (×4): 1000 mg via ORAL
  Filled 2014-06-21 (×5): qty 2

## 2014-06-21 MED ORDER — ONDANSETRON HCL 4 MG PO TABS: 4.0000 mg | ORAL_TABLET | Freq: Four times a day (QID) | ORAL | Status: DC | PRN

## 2014-06-21 MED ORDER — FENTANYL CITRATE 0.05 MG/ML IJ SOLN
INTRAMUSCULAR | Status: AC
  Filled 2014-06-21: qty 2

## 2014-06-21 MED ORDER — OXYCODONE HCL 5 MG PO TABS
5.0000 mg | ORAL_TABLET | ORAL | Status: DC | PRN
  Administered 2014-06-21: 5 mg via ORAL
  Filled 2014-06-21: qty 1

## 2014-06-21 MED ORDER — ONDANSETRON HCL 4 MG/2ML IJ SOLN
INTRAMUSCULAR | Status: AC
  Filled 2014-06-21: qty 2

## 2014-06-21 MED ORDER — 0.9 % SODIUM CHLORIDE (POUR BTL) OPTIME
TOPICAL | Status: DC | PRN
  Administered 2014-06-21: 1000 mL

## 2014-06-21 MED ORDER — PANTOPRAZOLE SODIUM 40 MG PO TBEC
40.0000 mg | DELAYED_RELEASE_TABLET | Freq: Every day | ORAL | Status: DC
  Administered 2014-06-22: 40 mg via ORAL
  Filled 2014-06-21: qty 1

## 2014-06-21 MED ORDER — DOCUSATE SODIUM 100 MG PO CAPS
100.0000 mg | ORAL_CAPSULE | Freq: Two times a day (BID) | ORAL | Status: DC
  Administered 2014-06-21 – 2014-06-23 (×4): 100 mg via ORAL

## 2014-06-21 MED ORDER — BUPIVACAINE HCL (PF) 0.25 % IJ SOLN
INTRAMUSCULAR | Status: DC | PRN
  Administered 2014-06-21: 30 mL

## 2014-06-21 MED ORDER — ONDANSETRON HCL 4 MG/2ML IJ SOLN: 4.0000 mg | Freq: Four times a day (QID) | INTRAMUSCULAR | Status: DC | PRN

## 2014-06-21 MED ORDER — BUPIVACAINE HCL (PF) 0.5 % IJ SOLN
INTRAMUSCULAR | Status: DC | PRN
  Administered 2014-06-21: 3 mL

## 2014-06-21 MED ORDER — CEFAZOLIN SODIUM-DEXTROSE 2-3 GM-% IV SOLR
2.0000 g | INTRAVENOUS | Status: AC
  Administered 2014-06-21: 2 g via INTRAVENOUS

## 2014-06-21 MED ORDER — TRAMADOL HCL 50 MG PO TABS
50.0000 mg | ORAL_TABLET | Freq: Four times a day (QID) | ORAL | Status: DC | PRN
  Administered 2014-06-22 – 2014-06-23 (×2): 50 mg via ORAL
  Filled 2014-06-21 (×2): qty 1

## 2014-06-21 MED ORDER — CHLORHEXIDINE GLUCONATE 4 % EX LIQD: 60.0000 mL | Freq: Once | CUTANEOUS | Status: DC

## 2014-06-21 MED ORDER — METHOCARBAMOL 1000 MG/10ML IJ SOLN
500.0000 mg | Freq: Four times a day (QID) | INTRAMUSCULAR | Status: DC | PRN
  Administered 2014-06-21: 500 mg via INTRAVENOUS
  Filled 2014-06-21 (×2): qty 5

## 2014-06-21 MED ORDER — PHENOL 1.4 % MT LIQD
1.0000 | OROMUCOSAL | Status: DC | PRN
Start: 2014-06-21 — End: 2014-06-23
  Filled 2014-06-21: qty 177

## 2014-06-21 MED ORDER — SODIUM CHLORIDE 0.9 % IV SOLN
INTRAVENOUS | Status: DC
  Administered 2014-06-21 – 2014-06-22 (×2): via INTRAVENOUS

## 2014-06-21 MED ORDER — SODIUM CHLORIDE 0.9 % IJ SOLN
INTRAMUSCULAR | Status: AC
  Filled 2014-06-21: qty 50

## 2014-06-21 SURGICAL SUPPLY — 43 items
BAG SPEC THK2 15X12 ZIP CLS (MISCELLANEOUS)
BAG ZIPLOCK 12X15 (MISCELLANEOUS) IMPLANT
BLADE EXTENDED COATED 6.5IN (ELECTRODE) ×3 IMPLANT
BLADE SAG 18X100X1.27 (BLADE) ×3 IMPLANT
CAPT HIP TOTAL 2 ×2 IMPLANT
CLOSURE WOUND 1/2 X4 (GAUZE/BANDAGES/DRESSINGS) ×1
COVER PERINEAL POST (MISCELLANEOUS) ×3 IMPLANT
DECANTER SPIKE VIAL GLASS SM (MISCELLANEOUS) ×3 IMPLANT
DRAPE C-ARM 42X120 X-RAY (DRAPES) ×3 IMPLANT
DRAPE STERI IOBAN 125X83 (DRAPES) ×3 IMPLANT
DRAPE U-SHAPE 47X51 STRL (DRAPES) ×9 IMPLANT
DRSG ADAPTIC 3X8 NADH LF (GAUZE/BANDAGES/DRESSINGS) ×3 IMPLANT
DRSG MEPILEX BORDER 4X4 (GAUZE/BANDAGES/DRESSINGS) ×3 IMPLANT
DRSG MEPILEX BORDER 4X8 (GAUZE/BANDAGES/DRESSINGS) ×3 IMPLANT
DRSG OPSITE POSTOP 4X6 (GAUZE/BANDAGES/DRESSINGS) ×2 IMPLANT
DURAPREP 26ML APPLICATOR (WOUND CARE) ×3 IMPLANT
ELECT REM PT RETURN 9FT ADLT (ELECTROSURGICAL) ×3
ELECTRODE REM PT RTRN 9FT ADLT (ELECTROSURGICAL) ×1 IMPLANT
EVACUATOR 1/8 PVC DRAIN (DRAIN) ×3 IMPLANT
FACESHIELD WRAPAROUND (MASK) ×12 IMPLANT
FACESHIELD WRAPAROUND OR TEAM (MASK) ×4 IMPLANT
GLOVE BIO SURGEON STRL SZ7.5 (GLOVE) ×3 IMPLANT
GLOVE BIO SURGEON STRL SZ8 (GLOVE) ×6 IMPLANT
GLOVE BIOGEL PI IND STRL 8 (GLOVE) ×2 IMPLANT
GLOVE BIOGEL PI INDICATOR 8 (GLOVE) ×4
GOWN STRL REUS W/TWL LRG LVL3 (GOWN DISPOSABLE) ×3 IMPLANT
GOWN STRL REUS W/TWL XL LVL3 (GOWN DISPOSABLE) ×3 IMPLANT
KIT BASIN OR (CUSTOM PROCEDURE TRAY) ×3 IMPLANT
NDL SAFETY ECLIPSE 18X1.5 (NEEDLE) ×2 IMPLANT
NEEDLE HYPO 18GX1.5 SHARP (NEEDLE) ×6
PACK TOTAL JOINT (CUSTOM PROCEDURE TRAY) ×3 IMPLANT
STRIP CLOSURE SKIN 1/2X4 (GAUZE/BANDAGES/DRESSINGS) ×2 IMPLANT
SUT ETHIBOND NAB CT1 #1 30IN (SUTURE) ×3 IMPLANT
SUT MNCRL AB 4-0 PS2 18 (SUTURE) ×3 IMPLANT
SUT VIC AB 2-0 CT1 27 (SUTURE) ×6
SUT VIC AB 2-0 CT1 TAPERPNT 27 (SUTURE) ×2 IMPLANT
SUT VLOC 180 0 24IN GS25 (SUTURE) ×3 IMPLANT
SYR 20CC LL (SYRINGE) ×3 IMPLANT
SYR 50ML LL SCALE MARK (SYRINGE) ×3 IMPLANT
TAPE STRIPS DRAPE STRL (GAUZE/BANDAGES/DRESSINGS) ×2 IMPLANT
TOWEL OR 17X26 10 PK STRL BLUE (TOWEL DISPOSABLE) ×3 IMPLANT
TOWEL OR NON WOVEN STRL DISP B (DISPOSABLE) IMPLANT
TRAY FOLEY CATH 14FRSI W/METER (CATHETERS) ×3 IMPLANT

## 2014-06-21 NOTE — H&P (View-Only) (Signed)
Desiree Caldwell DOB: 01/05/1934 Single / Language: Cleophus Molt / Race: White Female Date of Admission:  06/21/2013 CC:  Right Hip Pain History of Present Illness The patient is a 81 year old female who comes in for a preoperative History and Physical. The patient is scheduled for a right total hip arthroplasty (anterior approach) to be performed by Dr. Dione Plover. Aluisio, MD at [facility] on 08-04-2014. The patient is a 81 year old female who presents today for follow up of their hip. The patient is being followed for their right hip pain and osteoarthritis. They are a couple of months out from IA injection with Dr.Kendall. Symptoms reported today include: pain. The patient feels that they are doing poorly. Note for "Follow-up Hip": Patient states that the injection lasted two weeks. She is having trouble sleeping. She is taking Aleve daily but states that she is ready to have something done to relieve the pain. Her right hip is hurting her at all times. She has had problems for close to a year now. She has pain with activity as well as at rest. It is limiting what she can and cannot do. She had an intraarticular injection which did not help. She is now having pain at night. She has advanced end-stage arthritis of the right hip with progressively worsening pain and dysfunction. At this point, the most predictable means of improving pain and function is total hip arthroplasty. She is ready to proceed with surgery. They have been treated conservatively in the past for the above stated problem and despite conservative measures, they continue to have progressive pain and severe functional limitations and dysfunction. They have failed non-operative management including home exercise, medications, and injections. It is felt that they would benefit from undergoing total joint replacement. Risks and benefits of the procedure have been discussed with the patient and they elect to proceed with surgery. There are no  active contraindications to surgery such as ongoing infection or rapidly progressive neurological disease.  Problem List/Past Medical  Primary localized osteoarthritis of right hip (M16.11) Osteoarthritis of right hip, unspecified osteoarthritis type (M16.11) High blood pressure Hypercholesterolemia Vertigo Diverticulosis  Allergies No Known Drug Allergies  Family History  Drug / Alcohol Addiction father Severe allergy sister First Degree Relatives  Social History  Drug/Alcohol Rehab (Currently) no Tobacco use never smoker Pain Contract no Illicit drug use no Marital status single Living situation live alone Number of flights of stairs before winded less than 1 Exercise Exercises weekly; does other Drug/Alcohol Rehab (Previously) no Alcohol use current drinker; less than 5 per week Current work status retired [employer]  Medication History  Hydrochlorothiazide (25MG  Tablet, Oral) Active. Fluticasone Propionate (50MCG/ACT Suspension, Nasal) Active. PriLOSEC OTC (20MG  Tablet DR, Oral) Active. Simvastatin (80MG  Tablet, Oral) Active. Aspirin EC (81MG  Tablet DR, Oral) Active.   Past Surgical History Hysterectomy partial (non-cancerous) Tubal Ligation  Review of Systems General Not Present- Chills, Fatigue, Fever, Memory Loss, Night Sweats, Weight Gain and Weight Loss. Skin Not Present- Eczema, Hives, Itching, Lesions and Rash. HEENT Not Present- Dentures, Double Vision, Headache, Hearing Loss, Tinnitus and Visual Loss. Respiratory Not Present- Allergies, Chronic Cough, Coughing up blood, Shortness of breath at rest and Shortness of breath with exertion. Cardiovascular Not Present- Chest Pain, Difficulty Breathing Lying Down, Murmur, Palpitations, Racing/skipping heartbeats and Swelling. Gastrointestinal Not Present- Abdominal Pain, Bloody Stool, Constipation,  Diarrhea, Difficulty Swallowing, Heartburn, Jaundice, Loss of appetitie, Nausea and Vomiting. Female Genitourinary Not  Present- Blood in Urine, Discharge, Flank Pain, Incontinence, Painful Urination, Urgency, Urinary frequency, Urinary Retention, Urinating at Night and Weak urinary stream. Musculoskeletal Present- Joint Pain. Not Present- Back Pain, Joint Swelling, Morning Stiffness, Muscle Pain, Muscle Weakness and Spasms. Neurological Not Present- Blackout spells, Difficulty with balance, Dizziness, Paralysis, Tremor and Weakness. Psychiatric Not Present- Insomnia.  Vitals  Weight: 150 lb Height: 65in Weight was reported by patient. Height was reported by patient. Body Surface Area: 1.75 m Body Mass Index: 24.96 kg/m  BP: 152/88 (Sitting, Right Arm, Standard)   Physical Exam  General Mental Status -Alert, cooperative and good historian. General Appearance-pleasant, Not in acute distress. Orientation-Oriented X3. Build & Nutrition-Well nourished and Well developed.  Head and Neck Head-normocephalic, atraumatic . Neck Global Assessment - supple, no bruit auscultated on the right, no bruit auscultated on the left.  Eye Vision-Wears contact lenses. Pupil - Bilateral-Regular and Round. Motion - Bilateral-EOMI.  Chest and Lung Exam Auscultation Breath sounds - clear at anterior chest wall and clear at posterior chest wall. Adventitious sounds - No Adventitious sounds.  Cardiovascular Auscultation Rhythm - Regular rate and rhythm. Heart Sounds - S1 WNL and S2 WNL. Murmurs & Other Heart Sounds - Auscultation of the heart reveals - No Murmurs.  Abdomen Palpation/Percussion Tenderness - Abdomen is non-tender to palpation. Rigidity (guarding) - Abdomen is soft. Auscultation Auscultation of the abdomen reveals - Bowel sounds normal.  Female Genitourinary Note: Not done, not pertinent to present illness   Musculoskeletal Note: On examination,  well-developed female alert and oriented in no apparent distress. Evaluation of her left hip shows normal range of motion with no discomfort. Her right hip flexion to 90, no internal rotation, about 10 degrees of external rotation, about 20-30 degrees of abduction. She has pain on range of motion of the hip. Her knee examination is normal. Pulse, sensation and motor are intact. She has an antalgic gait pattern.  RADIOGRAPHS: X-rays AP pelvis and lateral of the right hip show that she has bone-on-bone arthritis with some subchondral cyst formation. The left appears normal.   Assessment & Plan  Primary localized osteoarthritis of right hip (M16.11) Note:Plan is for a Right Total Hip Replacement - anterior approach by Dr. Wynelle Link.  Plan is to go to [facility].  PCP - Dr. Derrill Memo - Patient has been seen preoperatively and felt to be stable for surgery.  The patient does not have any contraindications and will receive TXA (tranexamic acid) prior to surgery.  Signed electronically by Joelene Millin, III PA-C

## 2014-06-21 NOTE — Anesthesia Postprocedure Evaluation (Signed)
Anesthesia Post Note  Patient: Desiree Caldwell  Procedure(s) Performed: Procedure(s) (LRB): RIGHT TOTAL HIP ARTHROPLASTY ANTERIOR APPROACH (Right)  Anesthesia type: MAC/SAB  Patient location: PACU  Post pain: Pain level controlled  Post assessment: Patient's Cardiovascular Status Stable  Last Vitals:  Filed Vitals:   06/21/14 1330  BP:   Pulse: 71  Temp: 36.4 C  Resp: 12    Post vital signs: Reviewed and stable  Level of consciousness: sedated  Complications: No apparent anesthesia complications

## 2014-06-21 NOTE — Progress Notes (Signed)
Utilization review completed.  

## 2014-06-21 NOTE — Interval H&P Note (Signed)
History and Physical Interval Note:  06/21/2014 9:33 AM  Desiree Caldwell  has presented today for surgery, with the diagnosis of OA OF RIGHT HIP  The various methods of treatment have been discussed with the patient and family. After consideration of risks, benefits and other options for treatment, the patient has consented to  Procedure(s): RIGHT TOTAL HIP ARTHROPLASTY ANTERIOR APPROACH (Right) as a surgical intervention .  The patient's history has been reviewed, patient examined, no change in status, stable for surgery.  I have reviewed the patient's chart and labs.  Questions were answered to the patient's satisfaction.     Gearlean Alf

## 2014-06-21 NOTE — Op Note (Signed)
OPERATIVE REPORT  PREOPERATIVE DIAGNOSIS: Osteoarthritis of the Right hip.   POSTOPERATIVE DIAGNOSIS: Osteoarthritis of the Right  hip.   PROCEDURE: Right total hip arthroplasty, anterior approach.   SURGEON: Gaynelle Arabian, MD   ASSISTANT: Ardeen Jourdain, Pa-C  ANESTHESIA:  Spinal  ESTIMATED BLOOD LOSS:- 225 ml  DRAINS: Hemovac x1.   COMPLICATIONS: None   CONDITION: PACU - hemodynamically stable.   BRIEF CLINICAL NOTE: Desiree Caldwell is a 81 y.o. female who has advanced end-  stage arthritis of her Right  hip with progressively worsening pain and  dysfunction.The patient has failed nonoperative management and presents for  total hip arthroplasty.   PROCEDURE IN DETAIL: After successful administration of spinal  anesthetic, the traction boots for the [facility] bed were placed on both  feet and the patient was placed onto the [facility] bed, boots placed into the leg  holders. The Right hip was then isolated from the perineum with plastic  drapes and prepped and draped in the usual sterile fashion. ASIS and  greater trochanter were marked and a oblique incision was made, starting  at about 1 cm lateral and 2 cm distal to the ASIS and coursing towards  the anterior cortex of the femur. The skin was cut with a 10 blade  through subcutaneous tissue to the level of the fascia overlying the  tensor fascia lata muscle. The fascia was then incised in line with the  incision at the junction of the anterior third and posterior 2/3rd. The  muscle was teased off the fascia and then the interval between the TFL  and the rectus was developed. The Hohmann retractor was then placed at  the top of the femoral neck over the capsule. The vessels overlying the  capsule were cauterized and the fat on top of the capsule was removed.  A Hohmann retractor was then placed anterior underneath the rectus  femoris to give exposure to the entire anterior capsule. A T-shaped  capsulotomy was  performed. The edges were tagged and the femoral head  was identified.       Osteophytes are removed off the superior acetabulum.  The femoral neck was then cut in situ with an oscillating saw. Traction  was then applied to the left lower extremity utilizing the [facility]  traction. The femoral head was then removed. Retractors were placed  around the acetabulum and then circumferential removal of the labrum was  performed. Osteophytes were also removed. Reaming starts at 45 mm to  medialize and  Increased in 2 mm increments to 49 mm. We reamed in  approximately 40 degrees of abduction, 20 degrees anteversion. A 50 mm  pinnacle acetabular shell was then impacted in anatomic position under  fluoroscopic guidance with excellent purchase. We did not need to place  any additional dome screws. A 32 mm neutral + 4 marathon liner was then  placed into the acetabular shell.       The femoral lift was then placed along the lateral aspect of the femur  just distal to the vastus ridge. The leg was  externally rotated and capsule  was stripped off the inferior aspect of the femoral neck down to the  level of the lesser trochanter, this was done with electrocautery. The femur was lifted after this was performed. The  leg was then placed and extended in adducted position to essentially delivering the femur. We also removed the capsule superiorly and the  piriformis from the piriformis  fossa to gain excellent exposure of the  proximal femur. Rongeur was used to remove some cancellous bone to get  into the lateral portion of the proximal femur for placement of the  initial starter reamer. The starter broaches was placed  the starter broach  and was shown to go down the center of the canal. Broaching  with the  Corail system was then performed starting at size 8, coursing  Up to size 12. A size 12 had excellent torsional and rotational  and axial stability. The trial standard offset neck was then placed  with a  32 + 1 trial head. The hip was then reduced. We confirmed that  the stem was in the canal both on AP and lateral x-rays. It also has excellent sizing. The hip was reduced with outstanding stability through full extension, full external rotation,  and then flexion in adduction internal rotation. AP pelvis was taken  and the leg lengths were measured and found to be exactly equal. Hip  was then dislocated again and the femoral head and neck removed. The  femoral broach was removed. Size 12 Corail stem with a standard offset  neck was then impacted into the femur following native anteversion. Has  excellent purchase in the canal. Excellent torsional and rotational and  axial stability. It is confirmed to be in the canal on AP and lateral  fluoroscopic views. The 32 + 1 metal head was placed and the hip  reduced with outstanding stability. Again AP pelvis was taken and it  confirmed that the leg lengths were equal. The wound was then copiously  irrigated with saline solution and the capsule reattached and repaired  with Ethibond suture.  20 mL of Exparel mixed with 50 mL of saline then additional 20 ml of .25% Bupivicaine injected into the capsule and into the edge of the tensor fascia lata as well as subcutaneous tissue. The fascia overlying the tensor fascia lata was  then closed with a running #1 V-Loc. Subcu was closed with interrupted  2-0 Vicryl and subcuticular running 4-0 Monocryl. Incision was cleaned  and dried. Steri-Strips and a bulky sterile dressing applied. Hemovac  drain was hooked to suction and then he was awakened and transported to  recovery in stable condition.        Please note that a surgical assistant was a medical necessity for this procedure to perform it in a safe and expeditious manner. Assistant was necessary to provide appropriate retraction of vital neurovascular structures and to prevent femoral fracture and allow for anatomic placement of the prosthesis.  Gaynelle Arabian, M.D.

## 2014-06-21 NOTE — Anesthesia Preprocedure Evaluation (Addendum)
Anesthesia Evaluation  Patient identified by MRN, date of birth, ID band Patient awake    Reviewed: Allergy & Precautions, NPO status , Patient's Chart, lab work & pertinent test results  History of Anesthesia Complications (+) PROLONGED EMERGENCE and history of anesthetic complications  Airway Mallampati: II  TM Distance: >3 FB Neck ROM: Full    Dental  (+) Teeth Intact, Dental Advisory Given   Pulmonary neg pulmonary ROS,    Pulmonary exam normal       Cardiovascular hypertension,     Neuro/Psych negative neurological ROS  negative psych ROS   GI/Hepatic Neg liver ROS, GERD-  ,  Endo/Other  negative endocrine ROS  Renal/GU negative Renal ROS     Musculoskeletal   Abdominal   Peds  Hematology   Anesthesia Other Findings   Reproductive/Obstetrics                            Anesthesia Physical Anesthesia Plan  ASA: III  Anesthesia Plan: MAC and Spinal   Post-op Pain Management:    Induction:   Airway Management Planned: Simple Face Mask  Additional Equipment:   Intra-op Plan:   Post-operative Plan:   Informed Consent: I have reviewed the patients History and Physical, chart, labs and discussed the procedure including the risks, benefits and alternatives for the proposed anesthesia with the patient or authorized representative who has indicated his/her understanding and acceptance.   Dental advisory given  Plan Discussed with: CRNA, Anesthesiologist and Surgeon  Anesthesia Plan Comments:        Anesthesia Quick Evaluation

## 2014-06-21 NOTE — Anesthesia Procedure Notes (Signed)
Spinal Patient location during procedure: OR Start time: 06/21/2014 11:48 AM End time: 06/21/2014 11:58 AM Staffing Resident/CRNA: Darlys Gales R Performed by: resident/CRNA  Preanesthetic Checklist Completed: patient identified, site marked, surgical consent, pre-op evaluation, timeout performed, IV checked, risks and benefits discussed and monitors and equipment checked Spinal Block Patient position: sitting Prep: Betadine Patient monitoring: heart rate, continuous pulse ox and blood pressure Location: L3-4 Injection technique: single-shot Needle Needle type: Spinocan  Needle gauge: 22 G Needle length: 9 cm Needle insertion depth: 7.5 cm Assessment Sensory level: T6 Additional Notes Expiration date of kit checked and confirmed. Patient tolerated procedure well, without complications.

## 2014-06-21 NOTE — Transfer of Care (Signed)
Immediate Anesthesia Transfer of Care Note  Patient: Desiree Caldwell  Procedure(s) Performed: Procedure(s) (LRB): RIGHT TOTAL HIP ARTHROPLASTY ANTERIOR APPROACH (Right)  Patient Location: PACU  Anesthesia Type: Spinal  Level of Consciousness: sedated, patient cooperative and responds to stimulation  Airway & Oxygen Therapy: Patient Spontanous Breathing and Patient connected to face mask oxgen  Post-op Assessment: Report given to PACU RN and Post -op Vital signs reviewed and stable  Post vital signs: Reviewed and stable  Complications: No apparent anesthesia complications

## 2014-06-22 ENCOUNTER — Encounter (HOSPITAL_COMMUNITY): Payer: Self-pay | Admitting: Orthopedic Surgery

## 2014-06-22 LAB — CBC
HEMATOCRIT: 32.7 % — AB (ref 36.0–46.0)
Hemoglobin: 10.6 g/dL — ABNORMAL LOW (ref 12.0–15.0)
MCH: 27 pg (ref 26.0–34.0)
MCHC: 32.4 g/dL (ref 30.0–36.0)
MCV: 83.2 fL (ref 78.0–100.0)
Platelets: 268 10*3/uL (ref 150–400)
RBC: 3.93 MIL/uL (ref 3.87–5.11)
RDW: 15.2 % (ref 11.5–15.5)
WBC: 12.6 10*3/uL — ABNORMAL HIGH (ref 4.0–10.5)

## 2014-06-22 LAB — BASIC METABOLIC PANEL
Anion gap: 7 (ref 5–15)
BUN: 15 mg/dL (ref 6–23)
CHLORIDE: 105 mmol/L (ref 96–112)
CO2: 27 mmol/L (ref 19–32)
CREATININE: 0.84 mg/dL (ref 0.50–1.10)
Calcium: 8.8 mg/dL (ref 8.4–10.5)
GFR calc Af Amer: 75 mL/min — ABNORMAL LOW (ref >90)
GFR calc non Af Amer: 65 mL/min — ABNORMAL LOW (ref >90)
GLUCOSE: 141 mg/dL — AB (ref 70–99)
POTASSIUM: 3.4 mmol/L — AB (ref 3.5–5.1)
Sodium: 139 mmol/L (ref 135–145)

## 2014-06-22 MED ORDER — NON FORMULARY: 20.0000 mg | Freq: Every day | Status: DC

## 2014-06-22 MED ORDER — POTASSIUM CHLORIDE CRYS ER 20 MEQ PO TBCR
40.0000 meq | EXTENDED_RELEASE_TABLET | Freq: Every day | ORAL | Status: AC
  Administered 2014-06-22 – 2014-06-23 (×2): 40 meq via ORAL
  Filled 2014-06-22 (×2): qty 2

## 2014-06-22 MED ORDER — OMEPRAZOLE 20 MG PO CPDR
20.0000 mg | DELAYED_RELEASE_CAPSULE | Freq: Every day | ORAL | Status: DC
  Administered 2014-06-23: 20 mg via ORAL
  Filled 2014-06-22 (×3): qty 1

## 2014-06-22 MED ORDER — HYDROCHLOROTHIAZIDE 12.5 MG PO CAPS
12.5000 mg | ORAL_CAPSULE | Freq: Every day | ORAL | Status: DC
  Administered 2014-06-22 – 2014-06-23 (×2): 12.5 mg via ORAL
  Filled 2014-06-22 (×2): qty 1

## 2014-06-22 NOTE — Progress Notes (Signed)
Noted pt being D/C'd to [facility].  Will defer this OT eval to therapists at that facility. Jinger Neighbors, [place] [phone]

## 2014-06-22 NOTE — Care Management Note (Signed)
    Page 1 of 1   06/22/2014     12:17:52 PM CARE MANAGEMENT NOTE 06/22/2014  Patient:  Desiree Caldwell, Desiree Caldwell   Account Number:  [id number]  Date Initiated:  06/22/2014  Documentation initiated by:  [facility]  Subjective/Objective Assessment:   adm: RIGHT TOTAL HIP ARTHROPLASTY ANTERIOR APPROACH (Right)     Action/Plan:   discharge planning   Anticipated DC Date:  06/22/2014   Anticipated DC Plan:  [place]  CM consult      Choice offered to / List presented to:             Status of service:  Completed, signed off Medicare Important Message given?   (If response is "NO", the following Medicare IM given date fields will be blank) Date Medicare IM given:   Medicare IM given by:   Date Additional Medicare IM given:   Additional Medicare IM given by:    Discharge Disposition:  [place]  Per UR Regulation:    If discussed at Long Length of Stay Meetings, dates discussed:    Comments:  06/22/14 12:15 CM notes pt to go to [facility]; [place] arranging.  No other CM needs were communicated.  Mariane Masters, BSN, [place].

## 2014-06-22 NOTE — Evaluation (Signed)
Physical Therapy Evaluation Patient Details Name: Desiree Caldwell MRN: [id number] DOB: 1933-10-10 Today's Date: 06/22/2014   History of Present Illness  Pt is a 81 year old female s/p R direct anterior THA.  Clinical Impression  Pt is s/p R THA resulting in the deficits listed below (see PT Problem List).  Pt will benefit from skilled PT to increase their independence and safety with mobility to allow discharge to the venue listed below.  Pt mobilizing well POD #1 and plans to d/c to SNF.     Follow Up Recommendations SNF    Equipment Recommendations  None recommended by PT    Recommendations for Other Services       Precautions / Restrictions Precautions Precautions: None Restrictions Other Position/Activity Restrictions: WBAT      Mobility  Bed Mobility Overal bed mobility: Needs Assistance Bed Mobility: Supine to Sit     Supine to sit: Supervision     General bed mobility comments: unable to slide R LE over so verbal cues for L LE to assist  Transfers Overall transfer level: Needs assistance Equipment used: Rolling walker (2 wheeled) Transfers: Sit to/from Stand Sit to Stand: Min guard         General transfer comment: verbal cues for safe technique  Ambulation/Gait Ambulation/Gait assistance: Min guard Ambulation Distance (Feet): 120 Feet Assistive device: Rolling walker (2 wheeled) Gait Pattern/deviations: Step-through pattern;Decreased step length - left;Antalgic     General Gait Details: verbal cues for sequence, step length and RW distance, able to progress to step through  [employer] Mobility    Modified Rankin (Stroke Patients Only)       Balance                                             Pertinent Vitals/Pain Pain Assessment: 0-10 Pain Score: 3  Pain Location: R hip Pain Descriptors / Indicators: Sore Pain Intervention(s): Limited activity within patient's tolerance;Premedicated before  session;Repositioned;Ice applied    Home Living Family/patient expects to be discharged to:: Skilled nursing facility Living Arrangements: Alone                    Prior Function Level of Independence: Independent with assistive device(s)         Comments: was using SPC prior to surgery     Hand Dominance        Extremity/Trunk Assessment               Lower Extremity Assessment: RLE deficits/detail RLE Deficits / Details: hip weakness observed with functional activity       Communication   Communication: No difficulties  Cognition Arousal/Alertness: Awake/alert Behavior During Therapy: WFL for tasks assessed/performed Overall Cognitive Status: Within Functional Limits for tasks assessed                      General Comments      Exercises        Assessment/Plan    PT Assessment Patient needs continued PT services  PT Diagnosis Difficulty walking   PT Problem List Decreased strength;Decreased mobility;Pain;Decreased knowledge of use of DME  PT Treatment Interventions Functional mobility training;Gait training;DME instruction;Patient/family education;Therapeutic activities;Therapeutic exercise   PT Goals (Current goals can be found in the Care Plan section) Acute Rehab PT Goals PT  Goal Formulation: With patient Time For Goal Achievement: 06/26/14 Potential to Achieve Goals: Good    Frequency 7X/week   Barriers to discharge        Co-evaluation               End of Session Equipment Utilized During Treatment: Gait belt Activity Tolerance: Patient tolerated treatment well Patient left: in chair;with call bell/phone within reach;with nursing/sitter in room           Time: 0927-0945 PT Time Calculation (min) (ACUTE ONLY): 18 min   Charges:   PT Evaluation $Initial PT Evaluation Tier I: 1 Procedure     PT G Codes:        Ondrea Dow,KATHrine E 06/22/2014, 12:46 PM Carmelia Bake, PT, DPT 06/22/2014 Pager: [phone]

## 2014-06-22 NOTE — Progress Notes (Signed)
   Subjective: 1 Day Post-Op Procedure(s) (LRB): RIGHT TOTAL HIP ARTHROPLASTY ANTERIOR APPROACH (Right) Patient reports pain as mild.   Patient seen in rounds with Dr. Wynelle Link.  She had a good night on the evening of surgery. Patient is well, and has had no acute complaints or problems We will start therapy today.  Plan is to go [facility] after hospital stay.  Objective: Vital signs in last 24 hours: Temp:  [97.4 F (36.3 C)-98.7 F (37.1 C)] 98.1 F (36.7 C) (02/09 0545) Pulse Rate:  [62-86] 62 (02/09 0545) Resp:  [12-18] 16 (02/09 0545) BP: (110-144)/(49-71) 120/49 mmHg (02/09 0545) SpO2:  [95 %-100 %] 97 % (02/09 0545) Weight:  [72.122 kg (159 lb)] 72.122 kg (159 lb) (02/08 0904)  Intake/Output from previous day:  Intake/Output Summary (Last 24 hours) at 06/22/14 0756 Last data filed at 06/22/14 0723  Gross per 24 hour  Intake 3201.25 ml  Output   1455 ml  Net 1746.25 ml    Intake/Output this shift: Total I/O In: -  Out: 100 [Urine:100]  Labs:  Recent Labs  06/22/14 0558  HGB 10.6*    Recent Labs  06/22/14 0558  WBC 12.6*  RBC 3.93  HCT 32.7*  PLT 268    Recent Labs  06/22/14 0558  NA 139  K 3.4*  CL 105  CO2 27  BUN 15  CREATININE 0.84  GLUCOSE 141*  CALCIUM 8.8   No results for input(s): LABPT, INR in the last 72 hours.  EXAM General - Patient is Alert, Appropriate and Oriented Extremity - Neurovascular intact Sensation intact distally Dorsiflexion/Plantar flexion intact Dressing - dressing C/D/I Motor Function - intact, moving foot and toes well on exam.  Hemovac pulled without difficulty.  Past Medical History  Diagnosis Date  . Complication of anesthesia     SLOW TO WAKE UP  . Hypertension   . Hyperlipidemia   . Arthritis   . Osteopenia   . GERD (gastroesophageal reflux disease)   . Difficulty sleeping     DUE TO PAIN  . Seasonal allergies   . Diverticulosis     Assessment/Plan: 1 Day Post-Op Procedure(s)  (LRB): RIGHT TOTAL HIP ARTHROPLASTY ANTERIOR APPROACH (Right) Principal Problem:   OA (osteoarthritis) of hip  Estimated body mass index is 27.28 kg/(m^2) as calculated from the following:   Height as of this encounter: 5\' 4"  (1.626 m).   Weight as of this encounter: 72.122 kg (159 lb). Advance diet Up with therapy Plan for discharge tomorrow Discharge to SNF - [facility]  DVT Prophylaxis - Xarelto Weight Bearing As Tolerated right Leg Hemovac Pulled Begin Therapy  Arlee Muslim, PA-C Orthopaedic Surgery 06/22/2014, 7:56 AM

## 2014-06-22 NOTE — Progress Notes (Signed)
Clinical Social Work Department BRIEF PSYCHOSOCIAL ASSESSMENT 06/22/2014  Patient:  Desiree Caldwell, Desiree Caldwell     Account Number:  [id number]     Admit date:  06/21/2014  Clinical Social Worker:  Lacie Scotts  Date/Time:  06/15/2014 02:14 PM  Referred by:  Physician  Date Referred:  06/22/2014 Referred for  SNF Placement   Other Referral:   Interview type:  Patient Other interview type:    PSYCHOSOCIAL DATA Living Status:  ALONE Admitted from facility:   Level of care:   Primary support name:  Amy Lonia Skinner Primary support relationship to patient:  CHILD, ADULT Degree of support available:   supportive    CURRENT CONCERNS Current Concerns  Post-Acute Placement   Other Concerns:    SOCIAL WORK ASSESSMENT / PLAN Pt is a 81 yr old female living at home prior to hospitalization. CSW met with pt to assist with d/c planning. This is a planned admission. Pt has made prior arrangements to have ST Rehab at [facility] following hospital d/c. CSW has contacted SNF and d/c plans have been confirmed. CSW will continue to follow to assist with d/c planning to SNF.   Assessment/plan status:  Psychosocial Support/Ongoing Assessment of Needs Other assessment/ plan:   Information/referral to community resources:   Insurance coverage for SNF and ambulance transport reviewed.    PATIENT'S/FAMILY'S RESPONSE TO PLAN OF CARE: " I had a good night's sleep and I'm ready for my therapy. I can't wait to be able to do all those things I've missed. " Pt's mood is bright. She is motivated to work with therapy. Pt is looking forward to her rehab placement.    Werner Lean LCSW [phone]

## 2014-06-22 NOTE — Progress Notes (Signed)
Clinical Social Work Department CLINICAL SOCIAL WORK PLACEMENT NOTE 06/22/2014  Patient:  Desiree Caldwell, Desiree Caldwell  Account Number:  [id number] Admit date:  06/21/2014  Clinical Social Worker:  Werner Lean, LCSW  Date/time:  06/22/2014 02:22 PM  Clinical Social Work is seeking post-discharge placement for this patient at the following level of care:   SKILLED NURSING   (*CSW will update this form in Epic as items are completed)     Patient/family provided with [facility] Department of Clinical Social Work's list of facilities offering this level of care within the geographic area requested by the patient (or if unable, by the patient's family).  06/22/2014  Patient/family informed of their freedom to choose among providers that offer the needed level of care, that participate in Medicare, Medicaid or managed care program needed by the patient, have an available bed and are willing to accept the patient.    Patient/family informed of [facility]' ownership interest in [facility], as well as of the fact that they are under no obligation to receive care at this facility.  PASARR submitted to EDS on 06/22/2014 PASARR number received on 06/22/2014  FL2 transmitted to all facilities in geographic area requested by pt/family on  06/22/2014 FL2 transmitted to all facilities within larger geographic area on   Patient informed that his/her managed care company has contracts with or will negotiate with  certain facilities, including the following:     Patient/family informed of bed offers received:  06/22/2014 Patient chooses bed at [facility] Physician recommends and patient chooses bed at    Patient to be transferred to  on   Patient to be transferred to facility by  Patient and family notified of transfer on  Name of family member notified:    The following physician request were entered in Epic:   Additional Comments:  Werner Lean LCSW [phone]

## 2014-06-22 NOTE — Progress Notes (Signed)
Physical Therapy Treatment Note    06/22/14 1400  PT Visit Information  Last PT Received On 06/22/14  Assistance Needed +1  History of Present Illness Pt is a 81 year old female s/p R direct anterior THA.  PT Time Calculation  PT Start Time (ACUTE ONLY) 1332  PT Stop Time (ACUTE ONLY) 1346  PT Time Calculation (min) (ACUTE ONLY) 14 min  Subjective Data  Subjective Pt able to tolerate improvement in gait distance this afternoon and then performed a couple exercises.  Precautions  Precautions None  Restrictions  Other Position/Activity Restrictions WBAT  Pain Assessment  Pain Assessment 0-10  Pain Score 3  Pain Location R hip  Pain Descriptors / Indicators Discomfort;Sore  Pain Intervention(s) Limited activity within patient's tolerance;Monitored during session;Repositioned;Ice applied  Cognition  Arousal/Alertness Awake/alert  Behavior During Therapy WFL for tasks assessed/performed  Overall Cognitive Status Within Functional Limits for tasks assessed  Bed Mobility  Overal bed mobility Needs Assistance  Bed Mobility Sit to Supine  Sit to supine Supervision  General bed mobility comments verbal cues for self assist  Transfers  Overall transfer level Needs assistance  Equipment used Rolling walker (2 wheeled)  Transfers Sit to/from Stand  Sit to Stand Min guard  General transfer comment verbal cues for safe technique  Ambulation/Gait  Ambulation/Gait assistance Min guard  Ambulation Distance (Feet) 200 Feet  Assistive device Rolling walker (2 wheeled)  Gait Pattern/deviations Step-through pattern;Antalgic  General Gait Details verbal cues for step length and RW positioning  Exercises  Exercises Total Joint  Total Joint Exercises  Ankle Circles/Pumps AROM;Both;15 reps  [employer] AROM;15 reps;Both  Heel Slides AROM;10 reps;Right  Hip ABduction/ADduction AROM;Right;10 reps  PT - End of Session  Activity Tolerance Patient tolerated treatment well  Patient left in  bed;with call bell/phone within reach  PT - Assessment/Plan  PT Plan Current plan remains appropriate  PT Frequency (ACUTE ONLY) 7X/week  Follow Up Recommendations SNF  PT equipment None recommended by PT  PT Goal Progression  Progress towards PT goals Progressing toward goals  PT General Charges  $$ ACUTE PT VISIT 1 Procedure  PT Treatments  $Gait Training 8-22 mins   Carmelia Bake, PT, DPT 06/22/2014 Pager: [phone]

## 2014-06-22 NOTE — Discharge Instructions (Addendum)
Dr. Gaynelle Arabian Total Joint Specialist [facility] [address] [place],  [address] [phone]    ANTERIOR APPROACH TOTAL HIP REPLACEMENT POSTOPERATIVE DIRECTIONS   Hip Rehabilitation, Guidelines Following Surgery  The results of a hip operation are greatly improved after range of motion and muscle strengthening exercises. Follow all safety measures which are given to protect your hip. If any of these exercises cause increased pain or swelling in your joint, decrease the amount until you are comfortable again. Then slowly increase the exercises. Call your caregiver if you have problems or questions.  HOME CARE INSTRUCTIONS  Most of the following instructions are designed to prevent the dislocation of your new hip.  Remove items at home which could result in a fall. This includes throw rugs or furniture in walking pathways.  Continue medications as instructed at time of discharge.  You may have some home medications which will be placed on hold until you complete the course of blood thinner medication.  You may start showering once you are discharged home but do not submerge the incision under water. Just pat the incision dry and apply a dry gauze dressing on daily. Do not put on socks or shoes without following the instructions of your caregivers.  Sit on high chairs which makes it easier to stand.  Sit on chairs with arms. Use the chair arms to help push yourself up when arising.  Keep your leg on the side of the operation out in front of you when standing up.  Arrange for the use of a toilet seat elevator so you are not sitting low.    Walk with walker as instructed.  You may resume a sexual relationship in one month or when given the OK by your caregiver.  Use walker as long as suggested by your caregivers.  You may put full weight on your legs and walk as much as is comfortable. Avoid periods of inactivity such as sitting longer than an hour  when not asleep. This helps prevent blood clots.  You may return to work once you are cleared by [employer], production.  Do not drive a car for 6 weeks or until released by your surgeon.  Do not drive while taking narcotics.  Wear elastic stockings for three weeks following surgery during the day but you may remove then at night.  Make sure you keep all of your appointments after your operation with all of your doctors and caregivers. You should call the office at the above phone number and make an appointment for approximately two weeks after the date of your surgery. Change the dressing daily and reapply a dry dressing each time. Please pick up a stool softener and laxative for home use as long as you are requiring pain medications.  ICE to the affected hip every three hours for 30 minutes at a time and then as needed for pain and swelling.  Continue to use ice on the hip for pain and swelling from surgery. You may notice swelling that will progress down to the foot and ankle.  This is normal after surgery.  Elevate the leg when you are not up walking on it.   It is important for you to complete the blood thinner medication as prescribed by your doctor.  Continue to use the breathing machine which will help keep your temperature down.  It is common for your temperature to cycle up and down following surgery, especially at night when you are not up moving around  and exerting yourself.  The breathing machine keeps your lungs expanded and your temperature down.  RANGE OF MOTION AND STRENGTHENING EXERCISES  These exercises are designed to help you keep full movement of your hip joint. Follow your caregiver's or physical therapist's instructions. Perform all exercises about fifteen times, three times per day or as directed. Exercise both hips, even if you have had only one joint replacement. These exercises can be done on a training (exercise) mat, on the floor, on a table or on a bed. Use whatever works the best  and is most comfortable for you. Use music or television while you are exercising so that the exercises are a pleasant break in your day. This will make your life better with the exercises acting as a break in routine you can look forward to.  Lying on your back, slowly slide your foot toward your buttocks, raising your knee up off the floor. Then slowly slide your foot back down until your leg is straight again.  Lying on your back spread your legs as far apart as you can without causing discomfort.  Lying on your side, raise your upper leg and foot straight up from the floor as far as is comfortable. Slowly lower the leg and repeat.  Lying on your back, tighten up the muscle in the front of your thigh (quadriceps muscles). You can do this by keeping your leg straight and trying to raise your heel off the floor. This helps strengthen the largest muscle supporting your knee.  Lying on your back, tighten up the muscles of your buttocks both with the legs straight and with the knee bent at a comfortable angle while keeping your heel on the floor.   SKILLED REHAB INSTRUCTIONS: If the patient is transferred to a skilled rehab facility following release from the hospital, a list of the current medications will be sent to the facility for the patient to continue.  When discharged from the skilled rehab facility, please have the facility set up the patient's [place] prior to being released. Also, the skilled facility will be responsible for providing the patient with their medications at time of release from the facility to include their pain medication, the muscle relaxants, and their blood thinner medication. If the patient is still at the rehab facility at time of the two week follow up appointment, the skilled rehab facility will also need to assist the patient in arranging follow up appointment in our office and any transportation needs.  MAKE SURE YOU:  Understand these instructions.   Will watch your condition.  Will get help right away if you are not doing well or get worse.  Pick up stool softner and laxative for home use following surgery while on pain medications. Do not submerge incision under water. Please use good hand washing techniques while changing dressing each day. May shower starting three days after surgery. Please use a clean towel to pat the incision dry following showers. Continue to use ice for pain and swelling after surgery. Do not use any lotions or creams on the incision until instructed by your surgeon. Total Hip Protocol.  Take Xarelto for two and a half more weeks, then discontinue Xarelto. Once the patient has completed the Xarelto, they may resume the 81 mg Aspirin.  Postoperative Constipation Protocol  Constipation - defined medically as fewer than three stools per week and severe constipation as less than one stool per week.  One of the most common issues patients have  following surgery is constipation.  Even if you have a regular bowel pattern at home, your normal regimen is likely to be disrupted due to multiple reasons following surgery.  Combination of anesthesia, postoperative narcotics, change in appetite and fluid intake all can affect your bowels.  In order to avoid complications following surgery, here are some recommendations in order to help you during your recovery period.  Colace (docusate) - Pick up an over-the-counter form of Colace or another stool softener and take twice a day as long as you are requiring postoperative pain medications.  Take with a full glass of water daily.  If you experience loose stools or diarrhea, hold the colace until you stool forms back up.  If your symptoms do not get better within 1 week or if they get worse, check with your doctor.  Dulcolax (bisacodyl) - Pick up over-the-counter and take as directed by the product packaging as needed to assist with the movement of your bowels.  Take with a full  glass of water.  Use this product as needed if not relieved by Colace only.   MiraLax (polyethylene glycol) - Pick up over-the-counter to have on hand.  MiraLax is a solution that will increase the amount of water in your bowels to assist with bowel movements.  Take as directed and can mix with a glass of water, juice, soda, coffee, or tea.  Take if you go more than two days without a movement. Do not use MiraLax more than once per day. Call your doctor if you are still constipated or irregular after using this medication for 7 days in a row.  If you continue to have problems with postoperative constipation, please contact the office for further assistance and recommendations.  If you experience "the worst abdominal pain ever" or develop nausea or vomiting, please contact the office immediatly for further recommendations for treatment.   When discharged from the skilled rehab facility, please have the facility set up the patient's [place] prior to being released.   Also provide the patient with their medications at time of release from the facility to include their pain medication, the muscle relaxants, and their blood thinner medication.  If the patient is still at the rehab facility at time of follow up appointment, please also assist the patient in arranging follow up appointment in our office and any transportation needs. ICE to the affected knee or hip every three hours for 30 minutes at a time and then as needed for pain and swelling.    Information on my medicine - XARELTO (Rivaroxaban)  This medication education was reviewed with me or my healthcare representative as part of my discharge preparation.  The pharmacist that spoke with me during my hospital stay was:  Kara Mead, [facility]  Why was Xarelto prescribed for you? Xarelto was prescribed for you to reduce the risk of blood clots forming after orthopedic surgery. The medical term for these abnormal blood  clots is venous thromboembolism (VTE).  What do you need to know about xarelto ? Take your Xarelto ONCE DAILY at the same time every day. You may take it either with or without food.  If you have difficulty swallowing the tablet whole, you may crush it and mix in applesauce just prior to taking your dose.  Take Xarelto exactly as prescribed by your doctor and DO NOT stop taking Xarelto without talking to the doctor who prescribed the medication.  Stopping without other VTE prevention medication to take  the place of Xarelto may increase your risk of developing a clot.  After discharge, you should have regular check-up appointments with your healthcare provider that is prescribing your Xarelto.    What do you do if you miss a dose? If you miss a dose, take it as soon as you remember on the same day then continue your regularly scheduled once daily regimen the next day. Do not take two doses of Xarelto on the same day.   Important Safety Information A possible side effect of Xarelto is bleeding. You should call your healthcare provider right away if you experience any of the following: ? Bleeding from an injury or your nose that does not stop. ? Unusual colored urine (red or dark brown) or unusual colored stools (red or black). ? Unusual bruising for unknown reasons. ? A serious fall or if you hit your head (even if there is no bleeding).  Some medicines may interact with Xarelto and might increase your risk of bleeding while on Xarelto. To help avoid this, consult your healthcare provider or pharmacist prior to using any new prescription or non-prescription medications, including herbals, vitamins, non-steroidal anti-inflammatory drugs (NSAIDs) and supplements.  This website has more information on Xarelto: [email or web address]

## 2014-06-23 DIAGNOSIS — R2681 Unsteadiness on feet: Secondary | ICD-10-CM | POA: Diagnosis not present

## 2014-06-23 DIAGNOSIS — E785 Hyperlipidemia, unspecified: Secondary | ICD-10-CM | POA: Diagnosis not present

## 2014-06-23 DIAGNOSIS — Z471 Aftercare following joint replacement surgery: Secondary | ICD-10-CM | POA: Diagnosis not present

## 2014-06-23 DIAGNOSIS — K219 Gastro-esophageal reflux disease without esophagitis: Secondary | ICD-10-CM | POA: Diagnosis not present

## 2014-06-23 DIAGNOSIS — M8580 Other specified disorders of bone density and structure, unspecified site: Secondary | ICD-10-CM | POA: Diagnosis not present

## 2014-06-23 DIAGNOSIS — Z96641 Presence of right artificial hip joint: Secondary | ICD-10-CM | POA: Diagnosis not present

## 2014-06-23 DIAGNOSIS — R531 Weakness: Secondary | ICD-10-CM | POA: Diagnosis not present

## 2014-06-23 DIAGNOSIS — K5900 Constipation, unspecified: Secondary | ICD-10-CM | POA: Diagnosis not present

## 2014-06-23 DIAGNOSIS — J309 Allergic rhinitis, unspecified: Secondary | ICD-10-CM | POA: Diagnosis not present

## 2014-06-23 DIAGNOSIS — R278 Other lack of coordination: Secondary | ICD-10-CM | POA: Diagnosis not present

## 2014-06-23 DIAGNOSIS — M1611 Unilateral primary osteoarthritis, right hip: Secondary | ICD-10-CM | POA: Diagnosis not present

## 2014-06-23 DIAGNOSIS — D62 Acute posthemorrhagic anemia: Secondary | ICD-10-CM | POA: Diagnosis not present

## 2014-06-23 DIAGNOSIS — M6281 Muscle weakness (generalized): Secondary | ICD-10-CM | POA: Diagnosis not present

## 2014-06-23 DIAGNOSIS — I10 Essential (primary) hypertension: Secondary | ICD-10-CM | POA: Diagnosis not present

## 2014-06-23 LAB — BASIC METABOLIC PANEL
ANION GAP: 8 (ref 5–15)
BUN: 14 mg/dL (ref 6–23)
CALCIUM: 8.8 mg/dL (ref 8.4–10.5)
CO2: 29 mmol/L (ref 19–32)
CREATININE: 0.89 mg/dL (ref 0.50–1.10)
Chloride: 105 mmol/L (ref 96–112)
GFR calc Af Amer: 70 mL/min — ABNORMAL LOW (ref >90)
GFR calc non Af Amer: 60 mL/min — ABNORMAL LOW (ref >90)
Glucose, Bld: 121 mg/dL — ABNORMAL HIGH (ref 70–99)
Potassium: 3.3 mmol/L — ABNORMAL LOW (ref 3.5–5.1)
Sodium: 142 mmol/L (ref 135–145)

## 2014-06-23 LAB — CBC
HCT: 30.7 % — ABNORMAL LOW (ref 36.0–46.0)
HEMOGLOBIN: 10 g/dL — AB (ref 12.0–15.0)
MCH: 27.2 pg (ref 26.0–34.0)
MCHC: 32.6 g/dL (ref 30.0–36.0)
MCV: 83.7 fL (ref 78.0–100.0)
Platelets: 243 10*3/uL (ref 150–400)
RBC: 3.67 MIL/uL — ABNORMAL LOW (ref 3.87–5.11)
RDW: 15.5 % (ref 11.5–15.5)
WBC: 13.8 10*3/uL — AB (ref 4.0–10.5)

## 2014-06-23 MED ORDER — RIVAROXABAN 10 MG PO TABS: 10.0000 mg | ORAL_TABLET | Freq: Every day | ORAL | Status: DC

## 2014-06-23 MED ORDER — ONDANSETRON HCL 4 MG PO TABS: 4.0000 mg | ORAL_TABLET | Freq: Four times a day (QID) | ORAL | Status: DC | PRN

## 2014-06-23 MED ORDER — ACETAMINOPHEN 325 MG PO TABS: 650.0000 mg | ORAL_TABLET | Freq: Four times a day (QID) | ORAL | Status: DC | PRN

## 2014-06-23 MED ORDER — DOCUSATE SODIUM 100 MG PO CAPS: 100.0000 mg | ORAL_CAPSULE | Freq: Two times a day (BID) | ORAL | Status: DC

## 2014-06-23 MED ORDER — METOCLOPRAMIDE HCL 5 MG PO TABS: 5.0000 mg | ORAL_TABLET | Freq: Three times a day (TID) | ORAL | Status: DC | PRN

## 2014-06-23 MED ORDER — POLYETHYLENE GLYCOL 3350 17 G PO PACK: 17.0000 g | PACK | Freq: Every day | ORAL | Status: DC | PRN

## 2014-06-23 MED ORDER — BISACODYL 10 MG RE SUPP: 10.0000 mg | Freq: Every day | RECTAL | Status: DC | PRN

## 2014-06-23 MED ORDER — OXYCODONE HCL 5 MG PO TABS: 5.0000 mg | ORAL_TABLET | ORAL | Status: DC | PRN

## 2014-06-23 MED ORDER — METHOCARBAMOL 500 MG PO TABS: 500.0000 mg | ORAL_TABLET | Freq: Four times a day (QID) | ORAL | Status: DC | PRN

## 2014-06-23 MED ORDER — TRAMADOL HCL 50 MG PO TABS: 50.0000 mg | ORAL_TABLET | Freq: Four times a day (QID) | ORAL | Status: DC | PRN

## 2014-06-23 NOTE — Discharge Summary (Signed)
Physician Discharge Summary   Patient ID: Desiree Caldwell MRN: [id number] DOB/AGE: 05-22-1933 81 y.o.  Admit date: 06/21/2014 Discharge date: 06-23-2014  Primary Diagnosis:  Osteoarthritis of the Right hip.  Admission Diagnoses:  Past Medical History  Diagnosis Date  . Complication of anesthesia     SLOW TO WAKE UP  . Hypertension   . Hyperlipidemia   . Arthritis   . Osteopenia   . GERD (gastroesophageal reflux disease)   . Difficulty sleeping     DUE TO PAIN  . Seasonal allergies   . Diverticulosis    Discharge Diagnoses:   Principal Problem:   OA (osteoarthritis) of hip  Estimated body mass index is 27.28 kg/(m^2) as calculated from the following:   Height as of this encounter: '5\' 4"'  (1.626 m).   Weight as of this encounter: 72.122 kg (159 lb).  Procedure(s) (LRB): RIGHT TOTAL HIP ARTHROPLASTY ANTERIOR APPROACH (Right)   Consults: None  HPI: Desiree Caldwell is a 81 y.o. female who has advanced end-  stage arthritis of her Right hip with progressively worsening pain and  dysfunction.The patient has failed nonoperative management and presents for  total hip arthroplasty.   Laboratory Data: Admission on 06/21/2014  Component Date Value Ref Range Status  . WBC 06/22/2014 12.6* 4.0 - 10.5 K/uL Final  . RBC 06/22/2014 3.93  3.87 - 5.11 MIL/uL Final  . Hemoglobin 06/22/2014 10.6* 12.0 - 15.0 g/dL Final  . HCT 06/22/2014 32.7* 36.0 - 46.0 % Final  . MCV 06/22/2014 83.2  78.0 - 100.0 fL Final  . MCH 06/22/2014 27.0  26.0 - 34.0 pg Final  . MCHC 06/22/2014 32.4  30.0 - 36.0 g/dL Final  . RDW 06/22/2014 15.2  11.5 - 15.5 % Final  . Platelets 06/22/2014 268  150 - 400 K/uL Final  . Sodium 06/22/2014 139  135 - 145 mmol/L Final  . Potassium 06/22/2014 3.4* 3.5 - 5.1 mmol/L Final  . Chloride 06/22/2014 105  96 - 112 mmol/L Final  . CO2 06/22/2014 27  19 - 32 mmol/L Final  . Glucose, Bld 06/22/2014 141* 70 - 99 mg/dL Final  . BUN 06/22/2014 15  6 - 23 mg/dL Final  .  Creatinine, Ser 06/22/2014 0.84  0.50 - 1.10 mg/dL Final  . Calcium 06/22/2014 8.8  8.4 - 10.5 mg/dL Final  . GFR calc non Af Amer 06/22/2014 65* >90 mL/min Final  . GFR calc Af Amer 06/22/2014 75* >90 mL/min Final   Comment: (NOTE) The eGFR has been calculated using the CKD EPI equation. This calculation has not been validated in all clinical situations. eGFR's persistently <90 mL/min signify possible Chronic Kidney Disease.   . Anion gap 06/22/2014 7  5 - 15 Final  . WBC 06/23/2014 13.8* 4.0 - 10.5 K/uL Final  . RBC 06/23/2014 3.67* 3.87 - 5.11 MIL/uL Final  . Hemoglobin 06/23/2014 10.0* 12.0 - 15.0 g/dL Final  . HCT 06/23/2014 30.7* 36.0 - 46.0 % Final  . MCV 06/23/2014 83.7  78.0 - 100.0 fL Final  . MCH 06/23/2014 27.2  26.0 - 34.0 pg Final  . MCHC 06/23/2014 32.6  30.0 - 36.0 g/dL Final  . RDW 06/23/2014 15.5  11.5 - 15.5 % Final  . Platelets 06/23/2014 243  150 - 400 K/uL Final  . Sodium 06/23/2014 142  135 - 145 mmol/L Final  . Potassium 06/23/2014 3.3* 3.5 - 5.1 mmol/L Final  . Chloride 06/23/2014 105  96 - 112 mmol/L Final  . CO2 06/23/2014 29  19 - 32 mmol/L Final  . Glucose, Bld 06/23/2014 121* 70 - 99 mg/dL Final  . BUN 06/23/2014 14  6 - 23 mg/dL Final  . Creatinine, Ser 06/23/2014 0.89  0.50 - 1.10 mg/dL Final  . Calcium 06/23/2014 8.8  8.4 - 10.5 mg/dL Final  . GFR calc non Af Amer 06/23/2014 60* >90 mL/min Final  . GFR calc Af Amer 06/23/2014 70* >90 mL/min Final   Comment: (NOTE) The eGFR has been calculated using the CKD EPI equation. This calculation has not been validated in all clinical situations. eGFR's persistently <90 mL/min signify possible Chronic Kidney Disease.   Georgiann Hahn gap 06/23/2014 8  5 - 15 Final  Hospital Outpatient Visit on 06/14/2014  Component Date Value Ref Range Status  . MRSA, PCR 06/14/2014 NEGATIVE  NEGATIVE Final  . Staphylococcus aureus 06/14/2014 NEGATIVE  NEGATIVE Final   Comment:        The Xpert SA Assay (FDA approved for  NASAL specimens in patients over 9 years of age), is one component of a comprehensive surveillance program.  Test performance has been validated by [facility] for patients greater than or equal to 37 year old. It is not intended to diagnose infection nor to guide or monitor treatment.   Marland Kitchen aPTT 06/14/2014 35  24 - 37 seconds Final  . WBC 06/14/2014 7.3  4.0 - 10.5 K/uL Final  . RBC 06/14/2014 4.61  3.87 - 5.11 MIL/uL Final  . Hemoglobin 06/14/2014 12.6  12.0 - 15.0 g/dL Final  . HCT 06/14/2014 38.5  36.0 - 46.0 % Final  . MCV 06/14/2014 83.5  78.0 - 100.0 fL Final  . MCH 06/14/2014 27.3  26.0 - 34.0 pg Final  . MCHC 06/14/2014 32.7  30.0 - 36.0 g/dL Final  . RDW 06/14/2014 15.2  11.5 - 15.5 % Final  . Platelets 06/14/2014 289  150 - 400 K/uL Final  . Sodium 06/14/2014 141  135 - 145 mmol/L Final  . Potassium 06/14/2014 4.2  3.5 - 5.1 mmol/L Final  . Chloride 06/14/2014 104  96 - 112 mmol/L Final  . CO2 06/14/2014 29  19 - 32 mmol/L Final  . Glucose, Bld 06/14/2014 102* 70 - 99 mg/dL Final  . BUN 06/14/2014 18  6 - 23 mg/dL Final  . Creatinine, Ser 06/14/2014 1.24* 0.50 - 1.10 mg/dL Final  . Calcium 06/14/2014 9.7  8.4 - 10.5 mg/dL Final  . Total Protein 06/14/2014 7.7  6.0 - 8.3 g/dL Final  . Albumin 06/14/2014 4.2  3.5 - 5.2 g/dL Final  . AST 06/14/2014 20  0 - 37 U/L Final  . ALT 06/14/2014 16  0 - 35 U/L Final  . Alkaline Phosphatase 06/14/2014 64  39 - 117 U/L Final  . Total Bilirubin 06/14/2014 0.5  0.3 - 1.2 mg/dL Final  . GFR calc non Af Amer 06/14/2014 41* >90 mL/min Final  . GFR calc Af Amer 06/14/2014 47* >90 mL/min Final   Comment: (NOTE) The eGFR has been calculated using the CKD EPI equation. This calculation has not been validated in all clinical situations. eGFR's persistently <90 mL/min signify possible Chronic Kidney Disease.   . Anion gap 06/14/2014 8  5 - 15 Final  . Prothrombin Time 06/14/2014 12.9  11.6 - 15.2 seconds Final  . INR 06/14/2014 0.96   0.00 - 1.49 Final  . ABO/RH(D) 06/14/2014 O POS   Final  . Antibody Screen 06/14/2014 NEG   Final  . Sample Expiration 06/14/2014 06/24/2014  Final  . Color, Urine 06/14/2014 YELLOW  YELLOW Final  . APPearance 06/14/2014 CLEAR  CLEAR Final  . Specific Gravity, Urine 06/14/2014 1.017  1.005 - 1.030 Final  . pH 06/14/2014 5.0  5.0 - 8.0 Final  . Glucose, UA 06/14/2014 NEGATIVE  NEGATIVE mg/dL Final  . Hgb urine dipstick 06/14/2014 NEGATIVE  NEGATIVE Final  . Bilirubin Urine 06/14/2014 NEGATIVE  NEGATIVE Final  . Ketones, ur 06/14/2014 NEGATIVE  NEGATIVE mg/dL Final  . Protein, ur 06/14/2014 NEGATIVE  NEGATIVE mg/dL Final  . Urobilinogen, UA 06/14/2014 0.2  0.0 - 1.0 mg/dL Final  . Nitrite 06/14/2014 NEGATIVE  NEGATIVE Final  . Leukocytes, UA 06/14/2014 NEGATIVE  NEGATIVE Final   MICROSCOPIC NOT DONE ON URINES WITH NEGATIVE PROTEIN, BLOOD, LEUKOCYTES, NITRITE, OR GLUCOSE <1000 mg/dL.  . ABO/RH(D) 06/14/2014 O POS   Final     X-Rays:Dg Chest 2 View  06/14/2014   CLINICAL DATA:  Preop for hip surgery  EXAM: CHEST  2 VIEW  COMPARISON:  None.  FINDINGS: Cardiomediastinal silhouette is unremarkable. No acute infiltrate or pulmonary edema. Moderate-sized hiatal hernia measures 8 cm. Mild degenerative changes lower thoracic spine.  IMPRESSION: No active disease.  Moderate size hiatal hernia.   Electronically Signed   By: Lahoma Crocker M.D.   On: 06/14/2014 15:59   Dg Pelvis Portable  06/21/2014   CLINICAL DATA:  Postop right hip  EXAM: PORTABLE PELVIS 1-2 VIEWS  COMPARISON:  None.  FINDINGS: Total right hip arthroplasty. No periprosthetic fracture or dislocation.  Left hip osteoarthritis with spurring and mild joint narrowing.  L5-S1 degenerative disc disease with spurring.  IMPRESSION: New, total right hip arthroplasty.  No adverse findings.   Electronically Signed   By: Jorje Guild M.D.   On: 06/21/2014 13:56   Dg C-arm 1-60 Min-no Report  06/21/2014   CLINICAL DATA: surgery   C-ARM 1-60 MINUTES   Fluoroscopy was utilized by the requesting physician.  No radiographic  interpretation.     EKG: Orders placed or performed in visit on 06/14/14  . EKG 12-Lead     Hospital Course: Patient was admitted to [facility] and taken to the OR and underwent the above state procedure without complications.  Patient tolerated the procedure well and was later transferred to the recovery room and then to the orthopaedic floor for postoperative care.  They were given PO and IV analgesics for pain control following their surgery.  They were given 24 hours of postoperative antibiotics of  Anti-infectives    Start     Dose/Rate Route Frequency Ordered Stop   06/21/14 1800  ceFAZolin (ANCEF) IVPB 2 g/50 mL premix     2 g 100 mL/hr over 30 Minutes Intravenous Every 6 hours 06/21/14 1448 06/21/14 2356   06/21/14 0839  ceFAZolin (ANCEF) IVPB 2 g/50 mL premix     2 g 100 mL/hr over 30 Minutes Intravenous On call to O.R. 06/21/14 8099 06/21/14 1200     and started on DVT prophylaxis in the form of Xarelto.   PT and OT were ordered for total hip protocol.  The patient was allowed to be WBAT with therapy. Discharge planning was consulted to help with postop disposition and equipment needs. Social worker was consulted to assist with placement of the patient.  She wanted to look into [facility] postop.  Patient had a good night on the evening of surgery.  They started to get up OOB with therapy on day one.  Hemovac drain was pulled without  difficulty.  Continued to work with therapy into day two.  Dressing was changed on day two and the incision was healing well.   Patient was seen in rounds and was ready to go to [facility] once the bed was available.  Discharge to SNF Diet - Cardiac diet Follow up - in 1 week, Next Tuesday 06/29/2014, Call office at [phone] to set up appointment for patient. Activity - WBAT Disposition - Skilled nursing facility Condition Upon Discharge - Good D/C Meds - See DC  Summary DVT Prophylaxis - Xarelto       Discharge Instructions    Call MD / Call 911    Complete by:  As directed   If you experience chest pain or shortness of breath, CALL 911 and be transported to the hospital emergency room.  If you develope a fever above 101 F, pus (white drainage) or increased drainage or redness at the wound, or calf pain, call your surgeon's office.     Change dressing    Complete by:  As directed   You may change your dressing dressing daily with sterile 4 x 4 inch gauze dressing and paper tape.  Do not submerge the incision under water.     Constipation Prevention    Complete by:  As directed   Drink plenty of fluids.  Prune juice may be helpful.  You may use a stool softener, such as Colace (over the counter) 100 mg twice a day.  Use MiraLax (over the counter) for constipation as needed.     Diet - low sodium heart healthy    Complete by:  As directed      Discharge instructions    Complete by:  As directed   Pick up stool softner and laxative for home use following surgery while on pain medications. Do not submerge incision under water. Please use good hand washing techniques while changing dressing each day. May shower starting three days after surgery. Please use a clean towel to pat the incision dry following showers. Continue to use ice for pain and swelling after surgery. Do not use any lotions or creams on the incision until instructed by your surgeon.  Total Hip Protocol.  Take Xarelto for two and a half more weeks, then discontinue Xarelto. Once the patient has completed the Xarelto, they may resume the 81 mg Aspirin.  Postoperative Constipation Protocol  Constipation - defined medically as fewer than three stools per week and severe constipation as less than one stool per week.  One of the most common issues patients have following surgery is constipation.  Even if you have a regular bowel pattern at home, your normal regimen is likely to be  disrupted due to multiple reasons following surgery.  Combination of anesthesia, postoperative narcotics, change in appetite and fluid intake all can affect your bowels.  In order to avoid complications following surgery, here are some recommendations in order to help you during your recovery period.  Colace (docusate) - Pick up an over-the-counter form of Colace or another stool softener and take twice a day as long as you are requiring postoperative pain medications.  Take with a full glass of water daily.  If you experience loose stools or diarrhea, hold the colace until you stool forms back up.  If your symptoms do not get better within 1 week or if they get worse, check with your doctor.  Dulcolax (bisacodyl) - Pick up over-the-counter and take as directed by the product packaging as needed to  assist with the movement of your bowels.  Take with a full glass of water.  Use this product as needed if not relieved by Colace only.   MiraLax (polyethylene glycol) - Pick up over-the-counter to have on hand.  MiraLax is a solution that will increase the amount of water in your bowels to assist with bowel movements.  Take as directed and can mix with a glass of water, juice, soda, coffee, or tea.  Take if you go more than two days without a movement. Do not use MiraLax more than once per day. Call your doctor if you are still constipated or irregular after using this medication for 7 days in a row.  If you continue to have problems with postoperative constipation, please contact the office for further assistance and recommendations.  If you experience "the worst abdominal pain ever" or develop nausea or vomiting, please contact the office immediatly for further recommendations for treatment.  When discharged from the skilled rehab facility, please have the facility set up the patient's [place] prior to being released.   Also provide the patient with their medications at time of release  from the facility to include their pain medication, the muscle relaxants, and their blood thinner medication.  If the patient is still at the rehab facility at time of follow up appointment, please also assist the patient in arranging follow up appointment in our office and any transportation needs. ICE to the affected knee or hip every three hours for 30 minutes at a time and then as needed for pain and swelling.     Do not sit on low chairs, stoools or toilet seats, as it may be difficult to get up from low surfaces    Complete by:  As directed      Driving restrictions    Complete by:  As directed   No driving until released by the physician.     Increase activity slowly as tolerated    Complete by:  As directed      Lifting restrictions    Complete by:  As directed   No lifting until released by the physician.     Patient may shower    Complete by:  As directed   You may shower without a dressing once there is no drainage.  Do not wash over the wound.  If drainage remains, do not shower until drainage stops.     TED hose    Complete by:  As directed   Use stockings (TED hose) for 3 weeks on both leg(s).  You may remove them at night for sleeping.     Weight bearing as tolerated    Complete by:  As directed   Laterality:  right  Extremity:  Lower            Medication List    STOP taking these medications        aspirin EC 81 MG tablet     naproxen sodium 220 MG tablet  Commonly known as:  ANAPROX      TAKE these medications        acetaminophen 325 MG tablet  Commonly known as:  TYLENOL  Take 2 tablets (650 mg total) by mouth every 6 (six) hours as needed for mild pain (or Fever >/= 101).     bisacodyl 10 MG suppository  Commonly known as:  DULCOLAX  Place 1 suppository (10 mg total) rectally daily as needed for moderate constipation.  docusate sodium 100 MG capsule  Commonly known as:  COLACE  Take 1 capsule (100 mg total) by mouth 2 (two) times daily.      fluticasone 50 MCG/ACT nasal spray  Commonly known as:  FLONASE  Place 2 sprays into both nostrils every morning.     hydrochlorothiazide 25 MG tablet  Commonly known as:  HYDRODIURIL  Take 12.5 mg by mouth every morning.     methocarbamol 500 MG tablet  Commonly known as:  ROBAXIN  Take 1 tablet (500 mg total) by mouth every 6 (six) hours as needed for muscle spasms.     metoCLOPramide 5 MG tablet  Commonly known as:  REGLAN  Take 1 tablet (5 mg total) by mouth every 8 (eight) hours as needed for nausea (if ondansetron (ZOFRAN) ineffective.).     omeprazole 20 MG capsule  Commonly known as:  PRILOSEC  Take 20 mg by mouth every morning.     ondansetron 4 MG tablet  Commonly known as:  ZOFRAN  Take 1 tablet (4 mg total) by mouth every 6 (six) hours as needed for nausea.     oxyCODONE 5 MG immediate release tablet  Commonly known as:  Oxy IR/ROXICODONE  Take 1-2 tablets (5-10 mg total) by mouth every 3 (three) hours as needed for moderate pain, severe pain or breakthrough pain.     polyethylene glycol packet  Commonly known as:  MIRALAX / GLYCOLAX  Take 17 g by mouth daily as needed for mild constipation.     rivaroxaban 10 MG Tabs tablet  Commonly known as:  XARELTO  - Take 1 tablet (10 mg total) by mouth daily with breakfast. Take Xarelto for two and a half more weeks, then discontinue Xarelto.  - Once the patient has completed the Xarelto, they may resume the 81 mg Aspirin.     simvastatin 80 MG tablet  Commonly known as:  ZOCOR  Take 80 mg by mouth at bedtime.     traMADol 50 MG tablet  Commonly known as:  ULTRAM  Take 1-2 tablets (50-100 mg total) by mouth every 6 (six) hours as needed (mild pain).       Follow-up Information    Follow up with Gearlean Alf, MD On 06/29/2014.   Specialty:  Orthopedic Surgery   Why:  Call office ASAP to set up appointment on Tuesday 06/29/2014 with Dr. Denman George information:   [address] [place] [address] [place] [address] [phone]       Signed: Arlee Muslim, PA-C Orthopaedic Surgery 06/23/2014, 10:58 AM

## 2014-06-23 NOTE — Progress Notes (Signed)
Clinical Social Work Department CLINICAL SOCIAL WORK PLACEMENT NOTE 06/23/2014  Patient:  Desiree Caldwell, Desiree Caldwell  Account Number:  [id number] Admit date:  06/21/2014  Clinical Social Worker:  Werner Lean, LCSW  Date/time:  06/22/2014 02:22 PM  Clinical Social Work is seeking post-discharge placement for this patient at the following level of care:   SKILLED NURSING   (*CSW will update this form in Epic as items are completed)     Patient/family provided with [facility] Department of Clinical Social Work's list of facilities offering this level of care within the geographic area requested by the patient (or if unable, by the patient's family).  06/22/2014  Patient/family informed of their freedom to choose among providers that offer the needed level of care, that participate in Medicare, Medicaid or managed care program needed by the patient, have an available bed and are willing to accept the patient.    Patient/family informed of [facility]' ownership interest in [facility], as well as of the fact that they are under no obligation to receive care at this facility.  PASARR submitted to EDS on 06/22/2014 PASARR number received on 06/22/2014  FL2 transmitted to all facilities in geographic area requested by pt/family on  06/22/2014 FL2 transmitted to all facilities within larger geographic area on   Patient informed that his/her managed care company has contracts with or will negotiate with  certain facilities, including the following:     Patient/family informed of bed offers received:  06/22/2014 Patient chooses bed at [facility] Physician recommends and patient chooses bed at    Patient to be transferred to [facility] on  06/23/2014 Patient to be transferred to facility by [facility] Patient and family notified of transfer on 06/23/2014 Name of family member notified:  DAUGHTER  The following physician request were entered in Epic:   Additional Comments: Pt / family  are in agreement with d/c to SNF today. PT has approved transport by car. NSG reviewed d/c summary, scripts, avs. Scripts included in d/c packet. Packet provided to pt prior to d/c.  Werner Lean LCSW [phone]

## 2014-06-23 NOTE — Progress Notes (Signed)
Physical Therapy Treatment Patient Details Name: Desiree Caldwell MRN: [id number] DOB: [birth year] Today's Date: Jul 13, 2014    History of Present Illness Pt is a 81 year old female s/p R direct anterior THA.    PT Comments    Pt ambulating good distance and performed LE exercises. Pt to d/c to SNF later today.  Follow Up Recommendations  SNF     Equipment Recommendations  None recommended by PT    Recommendations for Other Services       Precautions / Restrictions Precautions Precautions: None Restrictions Other Position/Activity Restrictions: WBAT    Mobility  Bed Mobility Overal bed mobility: Needs Assistance Bed Mobility: Supine to Sit     Supine to sit: Supervision     General bed mobility comments: verbal cues for self assist  Transfers Overall transfer level: Needs assistance Equipment used: Rolling walker (2 wheeled) Transfers: Sit to/from Stand Sit to Stand: Min guard         General transfer comment: verbal cues for safe technique  Ambulation/Gait Ambulation/Gait assistance: Min guard Ambulation Distance (Feet): 200 Feet Assistive device: Rolling walker (2 wheeled) Gait Pattern/deviations: Step-through pattern;Antalgic;Decreased stride length     General Gait Details: verbal cues for step length and RW positioning   Stairs            Wheelchair Mobility    Modified Rankin (Stroke Patients Only)       Balance                                    Cognition Arousal/Alertness: Awake/alert Behavior During Therapy: WFL for tasks assessed/performed Overall Cognitive Status: Within Functional Limits for tasks assessed                      Exercises Total Joint Exercises Ankle Circles/Pumps: AROM;Both;15 reps Quad Sets: AROM;15 reps;Both Gluteal Sets: AROM;Both;15 reps Towel Squeeze: 15 reps;Both;AROM Short Arc Quad: AROM;Right;15 reps Heel Slides: AROM;Right;15 reps Hip ABduction/ADduction: AROM;Right;15  reps Long Arc Quad: AROM;Right;15 reps    General Comments        Pertinent Vitals/Pain Pain Assessment: 0-10 Pain Score: 1  Pain Location: R hip Pain Descriptors / Indicators: Sore Pain Intervention(s): Limited activity within patient's tolerance;Monitored during session;Repositioned;Ice applied    Home Living                      Prior Function            PT Goals (current goals can now be found in the care plan section) Progress towards PT goals: Progressing toward goals    Frequency  7X/week    PT Plan Current plan remains appropriate    Co-evaluation             End of Session   Activity Tolerance: Patient tolerated treatment well Patient left: in bed;with call bell/phone within reach     Time: 0913-0937 PT Time Calculation (min) (ACUTE ONLY): 24 min  Charges:  $Gait Training: 8-22 mins $Therapeutic Exercise: 8-22 mins                    G Codes:      Destyni Hoppel,KATHrine E 2014-07-13, 11:51 AM Carmelia Bake, PT, DPT 07-13-14 Pager: [phone]

## 2014-06-23 NOTE — Progress Notes (Signed)
   Subjective: 2 Days Post-Op Procedure(s) (LRB): RIGHT TOTAL HIP ARTHROPLASTY ANTERIOR APPROACH (Right) Patient reports pain as mild.   Patient seen in rounds with Dr. Wynelle Link. Patient is well, but has had some minor complaints of pain in the hip, requiring pain medications Patient is ready to go to [facility]  Objective: Vital signs in last 24 hours: Temp:  [98 F (36.7 C)-98.2 F (36.8 C)] 98 F (36.7 C) (02/10 0427) Pulse Rate:  [65-70] 65 (02/10 0427) Resp:  [15-16] 16 (02/10 0731) BP: (121-139)/(43-50) 124/49 mmHg (02/10 0427) SpO2:  [94 %-99 %] 97 % (02/10 0731)  Intake/Output from previous day:  Intake/Output Summary (Last 24 hours) at 06/23/14 1049 Last data filed at 06/23/14 0600  Gross per 24 hour  Intake    240 ml  Output   1425 ml  Net  -1185 ml    Labs:  Recent Labs  06/22/14 0558 06/23/14 0538  HGB 10.6* 10.0*    Recent Labs  06/22/14 0558 06/23/14 0538  WBC 12.6* 13.8*  RBC 3.93 3.67*  HCT 32.7* 30.7*  PLT 268 243    Recent Labs  06/22/14 0558 06/23/14 0538  NA 139 142  K 3.4* 3.3*  CL 105 105  CO2 27 29  BUN 15 14  CREATININE 0.84 0.89  GLUCOSE 141* 121*  CALCIUM 8.8 8.8   No results for input(s): LABPT, INR in the last 72 hours.  EXAM: General - Patient is Alert, Appropriate and Oriented Extremity - Neurovascular intact Sensation intact distally Dorsiflexion/Plantar flexion intact Incision - clean, dry, no drainage, healing Motor Function - intact, moving foot and toes well on exam.   Assessment/Plan: 2 Days Post-Op Procedure(s) (LRB): RIGHT TOTAL HIP ARTHROPLASTY ANTERIOR APPROACH (Right) Procedure(s) (LRB): RIGHT TOTAL HIP ARTHROPLASTY ANTERIOR APPROACH (Right) Past Medical History  Diagnosis Date  . Complication of anesthesia     SLOW TO WAKE UP  . Hypertension   . Hyperlipidemia   . Arthritis   . Osteopenia   . GERD (gastroesophageal reflux disease)   . Difficulty sleeping     DUE TO PAIN  . Seasonal  allergies   . Diverticulosis    Principal Problem:   OA (osteoarthritis) of hip  Estimated body mass index is 27.28 kg/(m^2) as calculated from the following:   Height as of this encounter: 5\' 4"  (1.626 m).   Weight as of this encounter: 72.122 kg (159 lb). Up with therapy Discharge to SNF Diet - Cardiac diet Follow up - in 1 week, Next Tuesday 06/29/2014 Activity - WBAT Disposition - Skilled nursing facility Condition Upon Discharge - Good D/C Meds - See DC Summary DVT Prophylaxis - Xarelto  Arlee Muslim, PA-C Orthopaedic Surgery 06/23/2014, 10:49 AM

## 2014-06-25 ENCOUNTER — Non-Acute Institutional Stay (SKILLED_NURSING_FACILITY): Payer: Medicare Other | Admitting: Adult Health

## 2014-06-25 DIAGNOSIS — E785 Hyperlipidemia, unspecified: Secondary | ICD-10-CM

## 2014-06-25 DIAGNOSIS — M1611 Unilateral primary osteoarthritis, right hip: Secondary | ICD-10-CM | POA: Diagnosis not present

## 2014-06-25 DIAGNOSIS — J309 Allergic rhinitis, unspecified: Secondary | ICD-10-CM

## 2014-06-25 DIAGNOSIS — K219 Gastro-esophageal reflux disease without esophagitis: Secondary | ICD-10-CM

## 2014-06-25 DIAGNOSIS — I10 Essential (primary) hypertension: Secondary | ICD-10-CM | POA: Diagnosis not present

## 2014-06-25 DIAGNOSIS — K5900 Constipation, unspecified: Secondary | ICD-10-CM | POA: Diagnosis not present

## 2014-06-25 DIAGNOSIS — D62 Acute posthemorrhagic anemia: Secondary | ICD-10-CM

## 2014-06-29 ENCOUNTER — Non-Acute Institutional Stay (SKILLED_NURSING_FACILITY): Payer: Medicare Other | Admitting: Adult Health

## 2014-06-29 DIAGNOSIS — J309 Allergic rhinitis, unspecified: Secondary | ICD-10-CM | POA: Diagnosis not present

## 2014-06-29 DIAGNOSIS — E785 Hyperlipidemia, unspecified: Secondary | ICD-10-CM

## 2014-06-29 DIAGNOSIS — D62 Acute posthemorrhagic anemia: Secondary | ICD-10-CM

## 2014-06-29 DIAGNOSIS — K5900 Constipation, unspecified: Secondary | ICD-10-CM | POA: Diagnosis not present

## 2014-06-29 DIAGNOSIS — Z96641 Presence of right artificial hip joint: Secondary | ICD-10-CM | POA: Diagnosis not present

## 2014-06-29 DIAGNOSIS — Z471 Aftercare following joint replacement surgery: Secondary | ICD-10-CM | POA: Diagnosis not present

## 2014-06-29 DIAGNOSIS — I10 Essential (primary) hypertension: Secondary | ICD-10-CM

## 2014-06-29 DIAGNOSIS — K219 Gastro-esophageal reflux disease without esophagitis: Secondary | ICD-10-CM

## 2014-06-29 DIAGNOSIS — M1611 Unilateral primary osteoarthritis, right hip: Secondary | ICD-10-CM

## 2014-06-30 DIAGNOSIS — Z96641 Presence of right artificial hip joint: Secondary | ICD-10-CM | POA: Diagnosis not present

## 2014-06-30 DIAGNOSIS — R269 Unspecified abnormalities of gait and mobility: Secondary | ICD-10-CM | POA: Diagnosis not present

## 2014-06-30 DIAGNOSIS — Z471 Aftercare following joint replacement surgery: Secondary | ICD-10-CM | POA: Diagnosis not present

## 2014-06-30 DIAGNOSIS — Z7982 Long term (current) use of aspirin: Secondary | ICD-10-CM | POA: Diagnosis not present

## 2014-06-30 DIAGNOSIS — I10 Essential (primary) hypertension: Secondary | ICD-10-CM | POA: Diagnosis not present

## 2014-06-30 DIAGNOSIS — R42 Dizziness and giddiness: Secondary | ICD-10-CM | POA: Diagnosis not present

## 2014-07-02 ENCOUNTER — Encounter: Payer: Self-pay | Admitting: Adult Health

## 2014-07-02 DIAGNOSIS — I10 Essential (primary) hypertension: Secondary | ICD-10-CM | POA: Diagnosis not present

## 2014-07-02 DIAGNOSIS — K219 Gastro-esophageal reflux disease without esophagitis: Secondary | ICD-10-CM | POA: Insufficient documentation

## 2014-07-02 DIAGNOSIS — Z96641 Presence of right artificial hip joint: Secondary | ICD-10-CM | POA: Diagnosis not present

## 2014-07-02 DIAGNOSIS — R269 Unspecified abnormalities of gait and mobility: Secondary | ICD-10-CM | POA: Diagnosis not present

## 2014-07-02 DIAGNOSIS — R42 Dizziness and giddiness: Secondary | ICD-10-CM | POA: Diagnosis not present

## 2014-07-02 DIAGNOSIS — Z471 Aftercare following joint replacement surgery: Secondary | ICD-10-CM | POA: Diagnosis not present

## 2014-07-02 DIAGNOSIS — Z7982 Long term (current) use of aspirin: Secondary | ICD-10-CM | POA: Diagnosis not present

## 2014-07-02 DIAGNOSIS — E785 Hyperlipidemia, unspecified: Secondary | ICD-10-CM | POA: Insufficient documentation

## 2014-07-02 DIAGNOSIS — J309 Allergic rhinitis, unspecified: Secondary | ICD-10-CM | POA: Insufficient documentation

## 2014-07-02 NOTE — Progress Notes (Signed)
Patient ID: Desiree Caldwell, female   DOB: 1933-09-11, 81 y.o.   MRN: [id number]   06/25/14  Facility:  Nursing Home Location:  [place] Room Number: 263-F LEVEL OF CARE:  SNF (31)   Chief Complaint  Patient presents with  . Hospitalization Follow-up    Osteoarthritis S/P right total hip arthroplasty, constipation, allergic rhinitis, hypertension, GERD, hyperlipidemia and anemia    HISTORY OF PRESENT ILLNESS:  This is a 81 year old female who has been admitted to [facility] on 06/23/14 from [facility] with osteoarthritis S/P right total hip arthroplasty. She has PMH of hypertension, GERD, diverticulosis and hyperlipidemia. She has been admitted for a short-term rehabilitation.  PAST MEDICAL HISTORY:  Past Medical History  Diagnosis Date  . Complication of anesthesia     SLOW TO WAKE UP  . Hypertension   . Hyperlipidemia   . Arthritis   . Osteopenia   . GERD (gastroesophageal reflux disease)   . Difficulty sleeping     DUE TO PAIN  . Seasonal allergies   . Diverticulosis     CURRENT MEDICATIONS: Reviewed per MAR/see medication list  No Known Allergies   REVIEW OF SYSTEMS:  GENERAL: no change in appetite, no fatigue, no weight changes, no fever, chills or weakness RESPIRATORY: no cough, SOB, DOE, wheezing, hemoptysis CARDIAC: no chest pain, edema or palpitations GI: no abdominal pain, diarrhea, constipation, heart burn, nausea or vomiting  PHYSICAL EXAMINATION  GENERAL: no acute distress, normal body habitus SKIN:  Right hip surgical incision is dry, no redness EYES: conjunctivae normal, sclerae normal, normal eye lids NECK: supple, trachea midline, no neck masses, no thyroid tenderness, no thyromegaly LYMPHATICS: no LAN in the neck, no supraclavicular LAN RESPIRATORY: breathing is even & unlabored, BS CTAB CARDIAC: RRR, no murmur,no extra heart sounds, no edema GI: abdomen soft, normal BS, no masses, no  tenderness, no hepatomegaly, no splenomegaly EXTREMITIES: Able to move 4 extremities PSYCHIATRIC: the patient is alert & oriented to person, affect & behavior appropriate  LABS/RADIOLOGY: Labs reviewed: Basic Metabolic Panel:  Recent Labs  06/14/14 1430 06/22/14 0558 06/23/14 0538  NA 141 139 142  K 4.2 3.4* 3.3*  CL 104 105 105  CO2 29 27 29   GLUCOSE 102* 141* 121*  BUN 18 15 14   CREATININE 1.24* 0.84 0.89  CALCIUM 9.7 8.8 8.8   Liver Function Tests:  Recent Labs  06/14/14 1430  AST 20  ALT 16  ALKPHOS 64  BILITOT 0.5  PROT 7.7  ALBUMIN 4.2   CBC:  Recent Labs  06/14/14 1430 06/22/14 0558 06/23/14 0538  WBC 7.3 12.6* 13.8*  HGB 12.6 10.6* 10.0*  HCT 38.5 32.7* 30.7*  MCV 83.5 83.2 83.7  PLT 289 268 243   Dg Chest 2 View  06/14/2014   CLINICAL DATA:  Preop for hip surgery  EXAM: CHEST  2 VIEW  COMPARISON:  None.  FINDINGS: Cardiomediastinal silhouette is unremarkable. No acute infiltrate or pulmonary edema. Moderate-sized hiatal hernia measures 8 cm. Mild degenerative changes lower thoracic spine.  IMPRESSION: No active disease.  Moderate size hiatal hernia.   Electronically Signed   By: Lahoma Crocker M.D.   On: 06/14/2014 15:59   Dg Pelvis Portable  06/21/2014   CLINICAL DATA:  Postop right hip  EXAM: PORTABLE PELVIS 1-2 VIEWS  COMPARISON:  None.  FINDINGS: Total right hip arthroplasty. No periprosthetic fracture or dislocation.  Left hip osteoarthritis with spurring and mild joint narrowing.  L5-S1 degenerative disc  disease with spurring.  IMPRESSION: New, total right hip arthroplasty.  No adverse findings.   Electronically Signed   By: Jorje Guild M.D.   On: 06/21/2014 13:56   Dg C-arm 1-60 Min-no Report  06/23/2014   : Fluoroscopy was utilized by the requesting physician. No radiographic interpretation.   Electronically Signed   By: Porfirio Mylar   On: 06/23/2014 13:11    ASSESSMENT/PLAN:  Osteoarthritis S/P Right total hip arthroplasty - for  rehabilitation; continue Robaxin 500 mg 1 tab by mouth every 6 hours when necessary for muscle spasms; OxyIR 5 mg 1-2 tabs by mouth every 3 hours when necessary for pain; Xarelto 10 mg by mouth daily 2 and half weeks; follow-up with orthopedics on 06/23/14 Constipation - continue Colace 100 mg by mouth twice a day Allergic rhinitis - continue Flonase 50 g/ACT nasal spray 2 sprays in each nare every morning Hypertension - well controlled; continue HCTZ 12.5 mg by mouth every morning GERD - continue Prilosec 20 mg by mouth every morning Hyperlipidemia - continue Zocor 80 mg by mouth daily at bedtime Anemia, acute blood loss - hemoglobin 10.0; stable   Goals of care:  Short-term rehabilitation   Labs/test ordered:  none   Spent 50 minutes in patient care.    [facility], NP [employer] [phone]

## 2014-07-02 NOTE — Progress Notes (Signed)
Patient ID: Desiree Caldwell, female   DOB: [birth year], 81 y.o.   MRN: [id number]   06/29/14  Facility:  Nursing Home Location:  [place] Room Number: 680-H LEVEL OF CARE:  SNF (31)   Chief Complaint  Patient presents with  . Discharge Note    Osteoarthritis S/P right total hip arthroplasty, constipation, allergic rhinitis, hypertension, GERD, hyperlipidemia and anemia    HISTORY OF PRESENT ILLNESS:  This is a 81 year old female who is for discharge home with Home health PT for endurance. She has been admitted to [facility] on 06/23/14 from [facility] with osteoarthritis S/P right total hip arthroplasty. She has PMH of hypertension, GERD, diverticulosis and hyperlipidemia. Patient was admitted to this facility for short-term rehabilitation after the patient's recent hospitalization.  Patient has completed SNF rehabilitation and therapy has cleared the patient for discharge.  PAST MEDICAL HISTORY:  Past Medical History  Diagnosis Date  . Complication of anesthesia     SLOW TO WAKE UP  . Hypertension   . Hyperlipidemia   . Arthritis   . Osteopenia   . GERD (gastroesophageal reflux disease)   . Difficulty sleeping     DUE TO PAIN  . Seasonal allergies   . Diverticulosis     CURRENT MEDICATIONS: Reviewed per MAR/see medication list  No Known Allergies   REVIEW OF SYSTEMS:  GENERAL: no change in appetite, no fatigue, no weight changes, no fever, chills or weakness RESPIRATORY: no cough, SOB, DOE, wheezing, hemoptysis CARDIAC: no chest pain, edema or palpitations GI: no abdominal pain, diarrhea, constipation, heart burn, nausea or vomiting  PHYSICAL EXAMINATION  GENERAL: no acute distress, normal body habitus SKIN:  Right hip surgical incision is dry, no redness NECK: supple, trachea midline, no neck masses, no thyroid tenderness, no thyromegaly LYMPHATICS: no LAN in the neck, no supraclavicular LAN RESPIRATORY:  breathing is even & unlabored, BS CTAB CARDIAC: RRR, no murmur,no extra heart sounds, no edema GI: abdomen soft, normal BS, no masses, no tenderness, no hepatomegaly, no splenomegaly EXTREMITIES: Able to move 4 extremities PSYCHIATRIC: the patient is alert & oriented to person, affect & behavior appropriate  LABS/RADIOLOGY: Labs reviewed: Basic Metabolic Panel:  Recent Labs  06/14/14 1430 06/22/14 0558 06/23/14 0538  NA 141 139 142  K 4.2 3.4* 3.3*  CL 104 105 105  CO2 29 27 29   GLUCOSE 102* 141* 121*  BUN 18 15 14   CREATININE 1.24* 0.84 0.89  CALCIUM 9.7 8.8 8.8   Liver Function Tests:  Recent Labs  06/14/14 1430  AST 20  ALT 16  ALKPHOS 64  BILITOT 0.5  PROT 7.7  ALBUMIN 4.2   CBC:  Recent Labs  06/14/14 1430 06/22/14 0558 06/23/14 0538  WBC 7.3 12.6* 13.8*  HGB 12.6 10.6* 10.0*  HCT 38.5 32.7* 30.7*  MCV 83.5 83.2 83.7  PLT 289 268 243   Dg Chest 2 View  06/14/2014   CLINICAL DATA:  Preop for hip surgery  EXAM: CHEST  2 VIEW  COMPARISON:  None.  FINDINGS: Cardiomediastinal silhouette is unremarkable. No acute infiltrate or pulmonary edema. Moderate-sized hiatal hernia measures 8 cm. Mild degenerative changes lower thoracic spine.  IMPRESSION: No active disease.  Moderate size hiatal hernia.   Electronically Signed   By: Lahoma Crocker M.D.   On: 06/14/2014 15:59   Dg Pelvis Portable  06/21/2014   CLINICAL DATA:  Postop right hip  EXAM: PORTABLE PELVIS 1-2 VIEWS  COMPARISON:  None.  FINDINGS:  Total right hip arthroplasty. No periprosthetic fracture or dislocation.  Left hip osteoarthritis with spurring and mild joint narrowing.  L5-S1 degenerative disc disease with spurring.  IMPRESSION: New, total right hip arthroplasty.  No adverse findings.   Electronically Signed   By: Jorje Guild M.D.   On: 06/21/2014 13:56   Dg C-arm 1-60 Min-no Report  06/23/2014   : Fluoroscopy was utilized by the requesting physician. No radiographic interpretation.   Electronically  Signed   By: Porfirio Mylar   On: 06/23/2014 13:11    ASSESSMENT/PLAN:  Osteoarthritis S/P Right total hip arthroplasty - for home health PT; continue Robaxin 500 mg 1 tab by mouth every 6 hours when necessary for muscle spasms; OxyIR 5 mg 1-2 tabs by mouth every 3 hours when necessary for pain; Xarelto 10 mg by mouth daily 12 days Constipation - continue Colace 100 mg by mouth twice a day Allergic rhinitis - continue Flonase 50 g/ACT nasal spray 2 sprays in each nare every morning Hypertension - well controlled; continue HCTZ 12.5 mg by mouth every morning GERD - continue Prilosec 20 mg by mouth every morning Hyperlipidemia - continue Zocor 80 mg by mouth daily at bedtime Anemia, acute blood loss - hemoglobin 10.0; stable   I have filled out patient's discharge paperwork and written prescriptions.  Patient will receive home health PT.  Total discharge time: Greater than 30 minutes  Discharge time involved coordination of the discharge process with social worker, nursing staff and therapy department. Medical justification for home health services verified.    [facility], NP [employer] [phone]

## 2014-07-06 DIAGNOSIS — I10 Essential (primary) hypertension: Secondary | ICD-10-CM | POA: Diagnosis not present

## 2014-07-06 DIAGNOSIS — R42 Dizziness and giddiness: Secondary | ICD-10-CM | POA: Diagnosis not present

## 2014-07-06 DIAGNOSIS — R269 Unspecified abnormalities of gait and mobility: Secondary | ICD-10-CM | POA: Diagnosis not present

## 2014-07-06 DIAGNOSIS — Z7982 Long term (current) use of aspirin: Secondary | ICD-10-CM | POA: Diagnosis not present

## 2014-07-06 DIAGNOSIS — Z96641 Presence of right artificial hip joint: Secondary | ICD-10-CM | POA: Diagnosis not present

## 2014-07-06 DIAGNOSIS — Z471 Aftercare following joint replacement surgery: Secondary | ICD-10-CM | POA: Diagnosis not present

## 2014-07-09 DIAGNOSIS — R269 Unspecified abnormalities of gait and mobility: Secondary | ICD-10-CM | POA: Diagnosis not present

## 2014-07-09 DIAGNOSIS — I10 Essential (primary) hypertension: Secondary | ICD-10-CM | POA: Diagnosis not present

## 2014-07-09 DIAGNOSIS — Z96641 Presence of right artificial hip joint: Secondary | ICD-10-CM | POA: Diagnosis not present

## 2014-07-09 DIAGNOSIS — R42 Dizziness and giddiness: Secondary | ICD-10-CM | POA: Diagnosis not present

## 2014-07-09 DIAGNOSIS — Z7982 Long term (current) use of aspirin: Secondary | ICD-10-CM | POA: Diagnosis not present

## 2014-07-09 DIAGNOSIS — Z471 Aftercare following joint replacement surgery: Secondary | ICD-10-CM | POA: Diagnosis not present

## 2014-07-13 DIAGNOSIS — Z471 Aftercare following joint replacement surgery: Secondary | ICD-10-CM | POA: Diagnosis not present

## 2014-07-13 DIAGNOSIS — R42 Dizziness and giddiness: Secondary | ICD-10-CM | POA: Diagnosis not present

## 2014-07-13 DIAGNOSIS — R269 Unspecified abnormalities of gait and mobility: Secondary | ICD-10-CM | POA: Diagnosis not present

## 2014-07-13 DIAGNOSIS — Z96641 Presence of right artificial hip joint: Secondary | ICD-10-CM | POA: Diagnosis not present

## 2014-07-13 DIAGNOSIS — Z7982 Long term (current) use of aspirin: Secondary | ICD-10-CM | POA: Diagnosis not present

## 2014-07-13 DIAGNOSIS — I10 Essential (primary) hypertension: Secondary | ICD-10-CM | POA: Diagnosis not present

## 2014-07-16 DIAGNOSIS — Z96641 Presence of right artificial hip joint: Secondary | ICD-10-CM | POA: Diagnosis not present

## 2014-07-16 DIAGNOSIS — R269 Unspecified abnormalities of gait and mobility: Secondary | ICD-10-CM | POA: Diagnosis not present

## 2014-07-16 DIAGNOSIS — I10 Essential (primary) hypertension: Secondary | ICD-10-CM | POA: Diagnosis not present

## 2014-07-16 DIAGNOSIS — R42 Dizziness and giddiness: Secondary | ICD-10-CM | POA: Diagnosis not present

## 2014-07-16 DIAGNOSIS — Z7982 Long term (current) use of aspirin: Secondary | ICD-10-CM | POA: Diagnosis not present

## 2014-07-16 DIAGNOSIS — Z471 Aftercare following joint replacement surgery: Secondary | ICD-10-CM | POA: Diagnosis not present

## 2014-07-20 DIAGNOSIS — Z471 Aftercare following joint replacement surgery: Secondary | ICD-10-CM | POA: Diagnosis not present

## 2014-07-20 DIAGNOSIS — Z96641 Presence of right artificial hip joint: Secondary | ICD-10-CM | POA: Diagnosis not present

## 2014-08-24 DIAGNOSIS — Z471 Aftercare following joint replacement surgery: Secondary | ICD-10-CM | POA: Diagnosis not present

## 2014-08-24 DIAGNOSIS — Z96641 Presence of right artificial hip joint: Secondary | ICD-10-CM | POA: Diagnosis not present

## 2014-09-28 ENCOUNTER — Other Ambulatory Visit: Payer: Self-pay | Admitting: Family Medicine

## 2014-09-28 DIAGNOSIS — R921 Mammographic calcification found on diagnostic imaging of breast: Secondary | ICD-10-CM

## 2014-11-04 DIAGNOSIS — H2513 Age-related nuclear cataract, bilateral: Secondary | ICD-10-CM | POA: Diagnosis not present

## 2014-11-04 DIAGNOSIS — H524 Presbyopia: Secondary | ICD-10-CM | POA: Diagnosis not present

## 2014-11-12 ENCOUNTER — Other Ambulatory Visit: Payer: Self-pay | Admitting: Family Medicine

## 2014-11-12 ENCOUNTER — Ambulatory Visit
Admission: RE | Admit: 2014-11-12 | Discharge: 2014-11-12 | Disposition: A | Discharge status: Home / Self Care | Payer: Medicare Other | Source: Ambulatory Visit | Attending: Family Medicine | Admitting: Family Medicine

## 2014-11-12 DIAGNOSIS — R921 Mammographic calcification found on diagnostic imaging of breast: Secondary | ICD-10-CM

## 2014-11-12 DIAGNOSIS — R920 Mammographic microcalcification found on diagnostic imaging of breast: Secondary | ICD-10-CM | POA: Diagnosis not present

## 2014-11-24 ENCOUNTER — Other Ambulatory Visit: Payer: Self-pay | Admitting: Family Medicine

## 2014-11-24 ENCOUNTER — Ambulatory Visit
Admission: RE | Admit: 2014-11-24 | Discharge: 2014-11-24 | Disposition: A | Discharge status: Home / Self Care | Payer: Medicare Other | Source: Ambulatory Visit | Attending: Family Medicine | Admitting: Family Medicine

## 2014-11-24 DIAGNOSIS — N6011 Diffuse cystic mastopathy of right breast: Secondary | ICD-10-CM | POA: Diagnosis not present

## 2014-11-24 DIAGNOSIS — R921 Mammographic calcification found on diagnostic imaging of breast: Secondary | ICD-10-CM

## 2014-11-24 HISTORY — PX: BREAST BIOPSY: SHX20

## 2014-11-26 DIAGNOSIS — H2512 Age-related nuclear cataract, left eye: Secondary | ICD-10-CM | POA: Diagnosis not present

## 2014-12-15 DIAGNOSIS — H25812 Combined forms of age-related cataract, left eye: Secondary | ICD-10-CM | POA: Diagnosis not present

## 2014-12-15 DIAGNOSIS — H2512 Age-related nuclear cataract, left eye: Secondary | ICD-10-CM | POA: Diagnosis not present

## 2015-02-01 DIAGNOSIS — H2511 Age-related nuclear cataract, right eye: Secondary | ICD-10-CM | POA: Diagnosis not present

## 2015-02-07 DIAGNOSIS — H25811 Combined forms of age-related cataract, right eye: Secondary | ICD-10-CM | POA: Diagnosis not present

## 2015-02-07 DIAGNOSIS — H2511 Age-related nuclear cataract, right eye: Secondary | ICD-10-CM | POA: Diagnosis not present

## 2015-03-13 DIAGNOSIS — Z23 Encounter for immunization: Secondary | ICD-10-CM | POA: Diagnosis not present

## 2015-03-29 DIAGNOSIS — S90862A Insect bite (nonvenomous), left foot, initial encounter: Secondary | ICD-10-CM | POA: Diagnosis not present

## 2015-03-29 DIAGNOSIS — L259 Unspecified contact dermatitis, unspecified cause: Secondary | ICD-10-CM | POA: Diagnosis not present

## 2015-03-29 DIAGNOSIS — L814 Other melanin hyperpigmentation: Secondary | ICD-10-CM | POA: Diagnosis not present

## 2015-03-29 DIAGNOSIS — L821 Other seborrheic keratosis: Secondary | ICD-10-CM | POA: Diagnosis not present

## 2015-03-29 DIAGNOSIS — D1801 Hemangioma of skin and subcutaneous tissue: Secondary | ICD-10-CM | POA: Diagnosis not present

## 2015-03-29 DIAGNOSIS — S90861A Insect bite (nonvenomous), right foot, initial encounter: Secondary | ICD-10-CM | POA: Diagnosis not present

## 2015-03-29 DIAGNOSIS — D225 Melanocytic nevi of trunk: Secondary | ICD-10-CM | POA: Diagnosis not present

## 2015-03-29 DIAGNOSIS — Z808 Family history of malignant neoplasm of other organs or systems: Secondary | ICD-10-CM | POA: Diagnosis not present

## 2015-04-22 DIAGNOSIS — I129 Hypertensive chronic kidney disease with stage 1 through stage 4 chronic kidney disease, or unspecified chronic kidney disease: Secondary | ICD-10-CM | POA: Diagnosis not present

## 2015-04-22 DIAGNOSIS — K219 Gastro-esophageal reflux disease without esophagitis: Secondary | ICD-10-CM | POA: Diagnosis not present

## 2015-04-22 DIAGNOSIS — J301 Allergic rhinitis due to pollen: Secondary | ICD-10-CM | POA: Diagnosis not present

## 2015-04-22 DIAGNOSIS — E782 Mixed hyperlipidemia: Secondary | ICD-10-CM | POA: Diagnosis not present

## 2015-04-22 DIAGNOSIS — Z0000 Encounter for general adult medical examination without abnormal findings: Secondary | ICD-10-CM | POA: Diagnosis not present

## 2015-04-22 DIAGNOSIS — M8580 Other specified disorders of bone density and structure, unspecified site: Secondary | ICD-10-CM | POA: Diagnosis not present

## 2015-04-22 DIAGNOSIS — N183 Chronic kidney disease, stage 3 (moderate): Secondary | ICD-10-CM | POA: Diagnosis not present

## 2015-05-27 DIAGNOSIS — R7309 Other abnormal glucose: Secondary | ICD-10-CM | POA: Diagnosis not present

## 2015-06-20 DIAGNOSIS — Z96641 Presence of right artificial hip joint: Secondary | ICD-10-CM | POA: Diagnosis not present

## 2015-06-20 DIAGNOSIS — Z471 Aftercare following joint replacement surgery: Secondary | ICD-10-CM | POA: Diagnosis not present

## 2015-07-22 ENCOUNTER — Encounter: Payer: Self-pay | Admitting: *Deleted

## 2015-09-14 DIAGNOSIS — H02834 Dermatochalasis of left upper eyelid: Secondary | ICD-10-CM | POA: Diagnosis not present

## 2015-09-14 DIAGNOSIS — H524 Presbyopia: Secondary | ICD-10-CM | POA: Diagnosis not present

## 2015-09-14 DIAGNOSIS — H02831 Dermatochalasis of right upper eyelid: Secondary | ICD-10-CM | POA: Diagnosis not present

## 2015-09-14 DIAGNOSIS — H02832 Dermatochalasis of right lower eyelid: Secondary | ICD-10-CM | POA: Diagnosis not present

## 2015-09-14 DIAGNOSIS — H04123 Dry eye syndrome of bilateral lacrimal glands: Secondary | ICD-10-CM | POA: Diagnosis not present

## 2015-09-14 DIAGNOSIS — H02835 Dermatochalasis of left lower eyelid: Secondary | ICD-10-CM | POA: Diagnosis not present

## 2015-09-14 DIAGNOSIS — H26493 Other secondary cataract, bilateral: Secondary | ICD-10-CM | POA: Diagnosis not present

## 2015-11-25 ENCOUNTER — Other Ambulatory Visit: Payer: Self-pay | Admitting: Family Medicine

## 2015-11-25 DIAGNOSIS — Z1231 Encounter for screening mammogram for malignant neoplasm of breast: Secondary | ICD-10-CM

## 2015-12-08 DIAGNOSIS — J209 Acute bronchitis, unspecified: Secondary | ICD-10-CM | POA: Diagnosis not present

## 2015-12-08 DIAGNOSIS — J0140 Acute pansinusitis, unspecified: Secondary | ICD-10-CM | POA: Diagnosis not present

## 2015-12-08 DIAGNOSIS — R05 Cough: Secondary | ICD-10-CM | POA: Diagnosis not present

## 2015-12-20 ENCOUNTER — Ambulatory Visit
Admission: RE | Admit: 2015-12-20 | Discharge: 2015-12-20 | Disposition: A | Discharge status: Home / Self Care | Payer: Medicare Other | Source: Ambulatory Visit | Attending: Family Medicine | Admitting: Family Medicine

## 2015-12-20 DIAGNOSIS — Z1231 Encounter for screening mammogram for malignant neoplasm of breast: Secondary | ICD-10-CM

## 2016-02-11 IMAGING — CR DG CHEST 2V
2 series · 2 of 2 positions shown · non-contrast
Comparison: None.

CLINICAL DATA: Preop for hip surgery

EXAM:
CHEST  2 VIEW

[w chest pa]
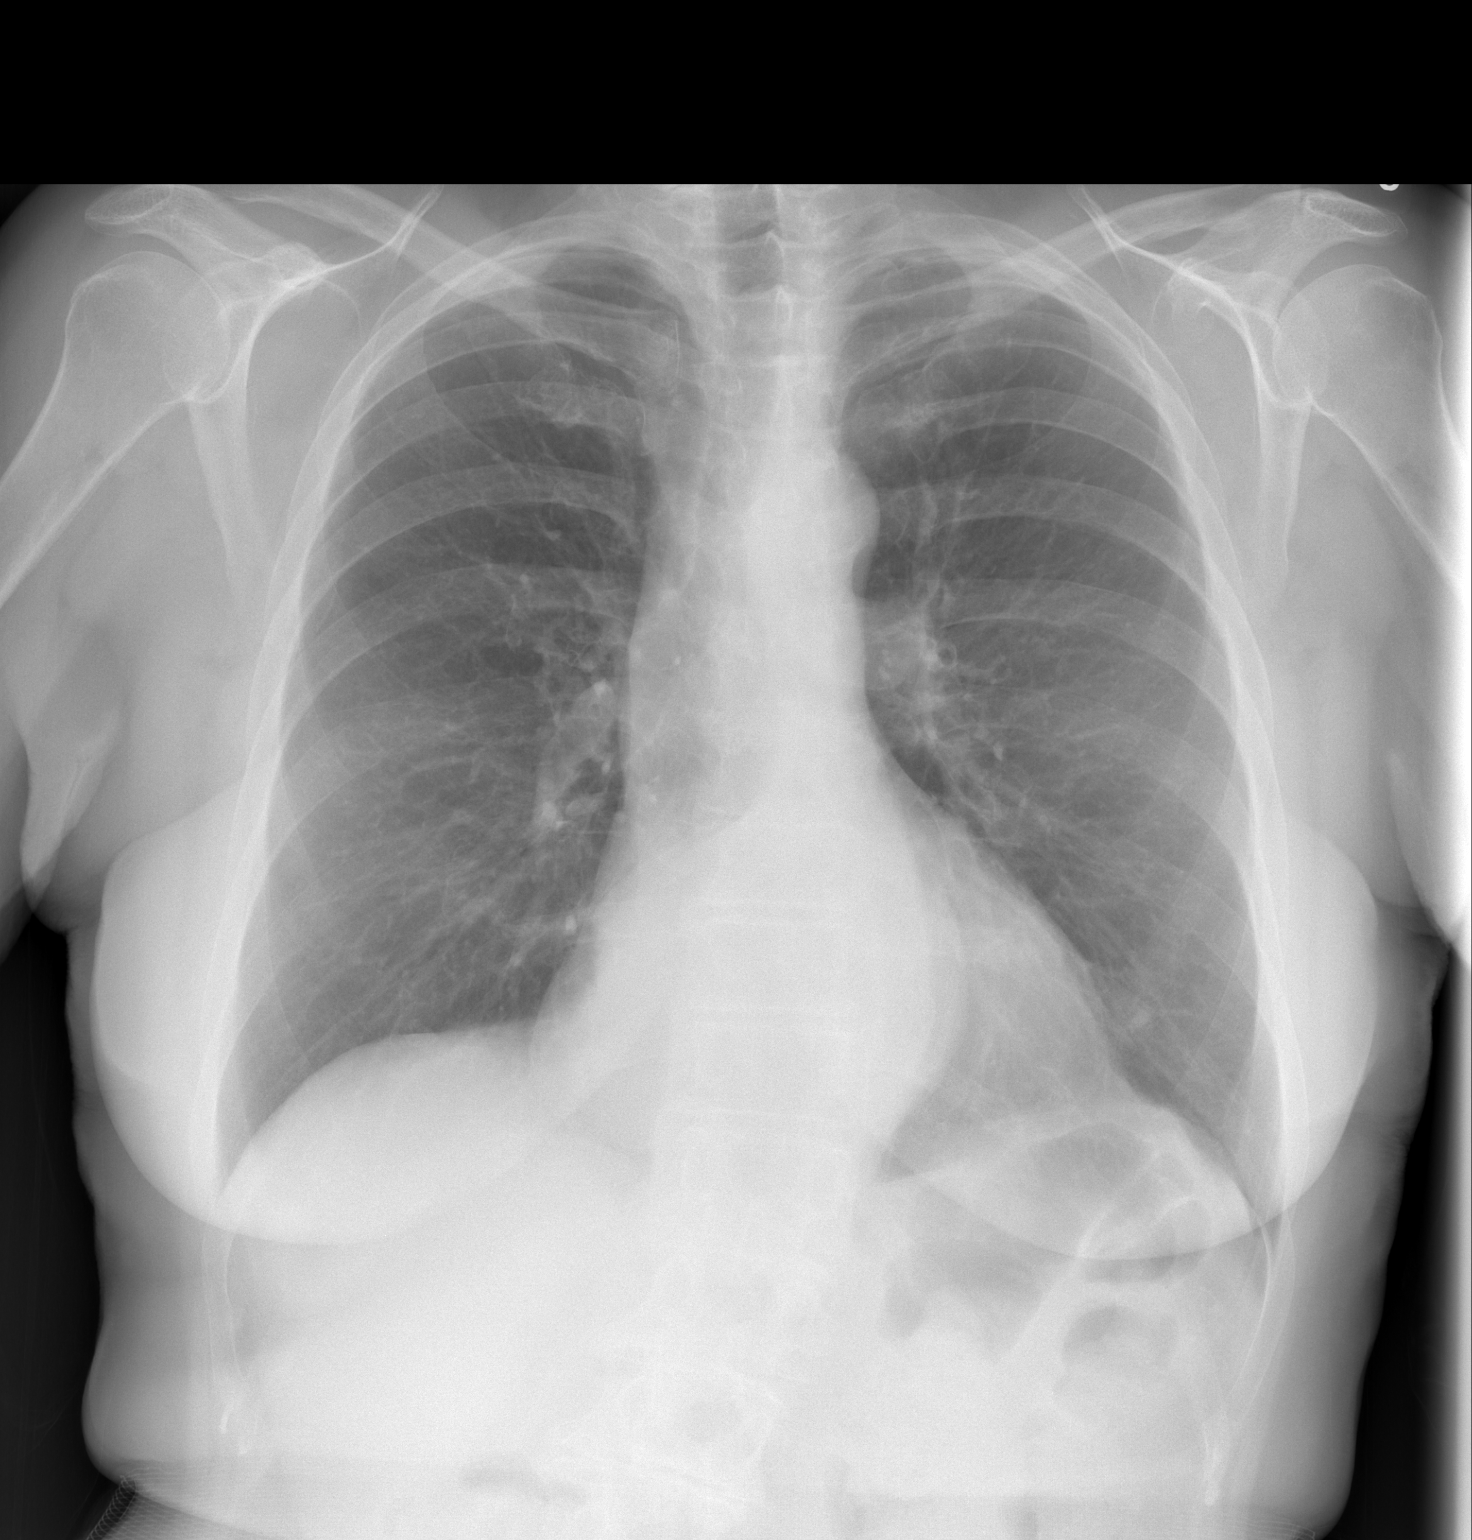

[w chest lat]
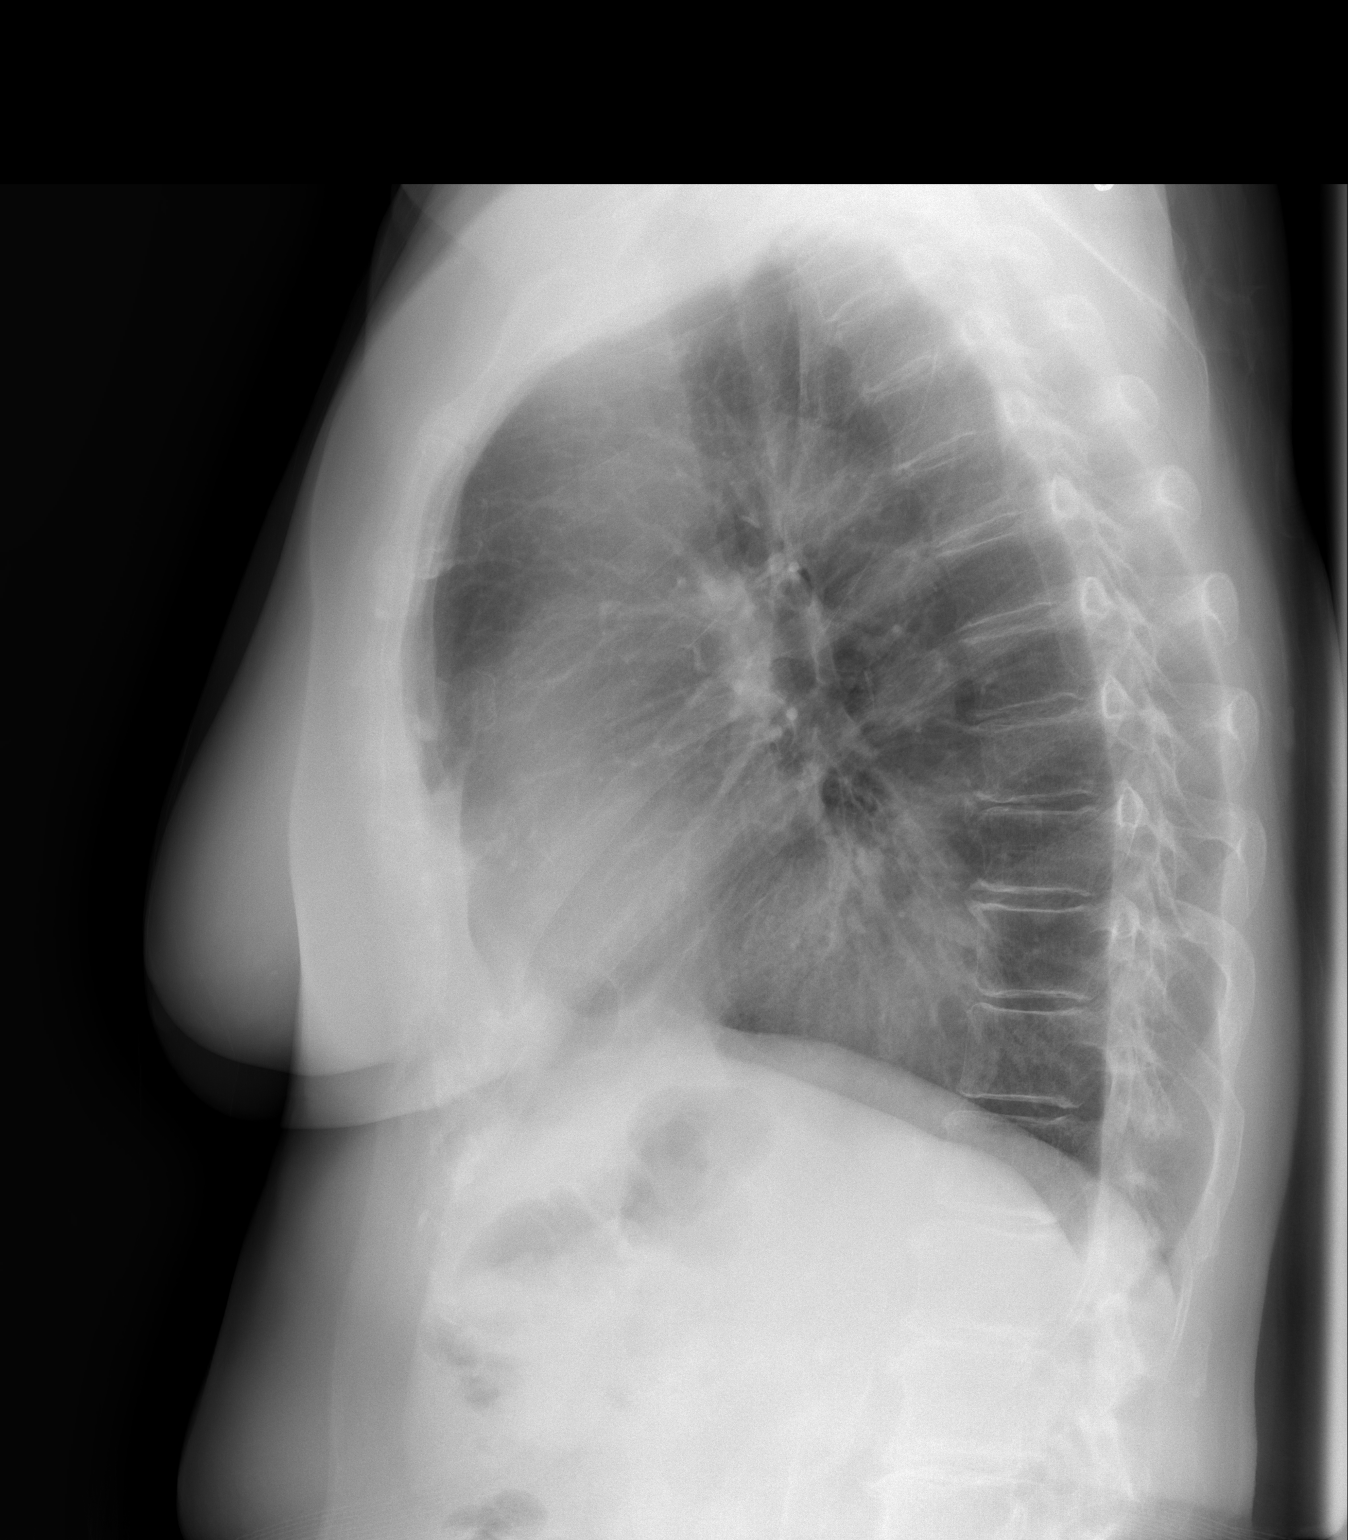

[2 of 2 positions shown; findings below may reference images not displayed]

FINDINGS: Cardiomediastinal silhouette is unremarkable. No acute infiltrate or
pulmonary edema. Moderate-sized hiatal hernia measures 8 cm. Mild
degenerative changes lower thoracic spine.
IMPRESSION: No active disease.  Moderate size hiatal hernia.

## 2016-02-18 IMAGING — CR DG PORTABLE PELVIS
1 series · 1 of 1 positions shown · non-contrast
Comparison: None.

CLINICAL DATA: Postop right hip

EXAM:
PORTABLE PELVIS 1-2 VIEWS

[AP]
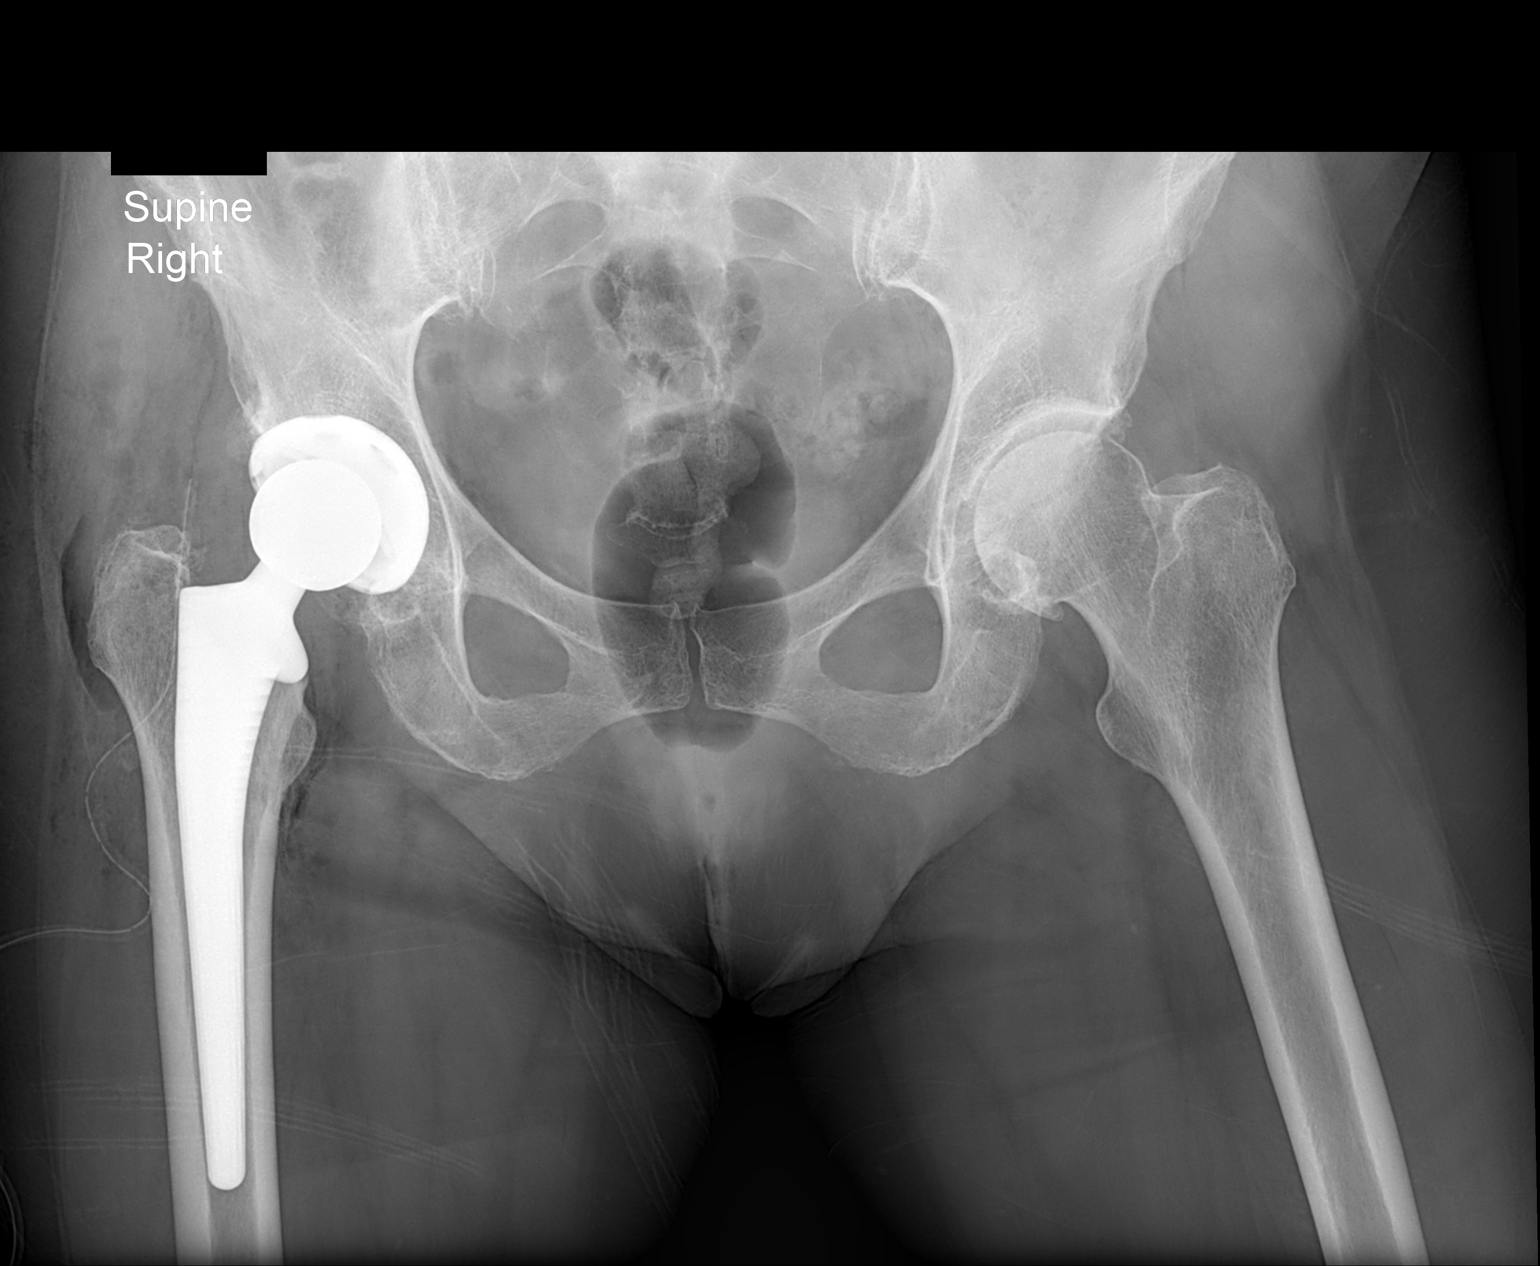

[1 of 1 positions shown; findings below may reference images not displayed]

FINDINGS: Total right hip arthroplasty. No periprosthetic fracture or
dislocation.

Left hip osteoarthritis with spurring and mild joint narrowing.

L5-S1 degenerative disc disease with spurring.
IMPRESSION: New, total right hip arthroplasty.  No adverse findings.

## 2016-04-18 DIAGNOSIS — D485 Neoplasm of uncertain behavior of skin: Secondary | ICD-10-CM | POA: Diagnosis not present

## 2016-04-19 DIAGNOSIS — D0439 Carcinoma in situ of skin of other parts of face: Secondary | ICD-10-CM | POA: Diagnosis not present

## 2016-06-20 DIAGNOSIS — D0430 Carcinoma in situ of skin of unspecified part of face: Secondary | ICD-10-CM | POA: Diagnosis not present

## 2016-06-22 DIAGNOSIS — E782 Mixed hyperlipidemia: Secondary | ICD-10-CM | POA: Diagnosis not present

## 2016-06-22 DIAGNOSIS — N183 Chronic kidney disease, stage 3 (moderate): Secondary | ICD-10-CM | POA: Diagnosis not present

## 2016-06-22 DIAGNOSIS — R7303 Prediabetes: Secondary | ICD-10-CM | POA: Diagnosis not present

## 2016-06-22 DIAGNOSIS — I129 Hypertensive chronic kidney disease with stage 1 through stage 4 chronic kidney disease, or unspecified chronic kidney disease: Secondary | ICD-10-CM | POA: Diagnosis not present

## 2016-06-22 DIAGNOSIS — M8580 Other specified disorders of bone density and structure, unspecified site: Secondary | ICD-10-CM | POA: Diagnosis not present

## 2016-06-22 DIAGNOSIS — Z0000 Encounter for general adult medical examination without abnormal findings: Secondary | ICD-10-CM | POA: Diagnosis not present

## 2016-06-22 DIAGNOSIS — K219 Gastro-esophageal reflux disease without esophagitis: Secondary | ICD-10-CM | POA: Diagnosis not present

## 2016-06-22 DIAGNOSIS — J301 Allergic rhinitis due to pollen: Secondary | ICD-10-CM | POA: Diagnosis not present

## 2016-08-21 DIAGNOSIS — M8588 Other specified disorders of bone density and structure, other site: Secondary | ICD-10-CM | POA: Diagnosis not present

## 2016-09-20 DIAGNOSIS — H26492 Other secondary cataract, left eye: Secondary | ICD-10-CM | POA: Diagnosis not present

## 2016-09-20 DIAGNOSIS — H04123 Dry eye syndrome of bilateral lacrimal glands: Secondary | ICD-10-CM | POA: Diagnosis not present

## 2016-09-20 DIAGNOSIS — Z961 Presence of intraocular lens: Secondary | ICD-10-CM | POA: Diagnosis not present

## 2016-09-24 DIAGNOSIS — Z85828 Personal history of other malignant neoplasm of skin: Secondary | ICD-10-CM | POA: Diagnosis not present

## 2016-09-24 DIAGNOSIS — L814 Other melanin hyperpigmentation: Secondary | ICD-10-CM | POA: Diagnosis not present

## 2016-09-24 DIAGNOSIS — D225 Melanocytic nevi of trunk: Secondary | ICD-10-CM | POA: Diagnosis not present

## 2016-09-24 DIAGNOSIS — L821 Other seborrheic keratosis: Secondary | ICD-10-CM | POA: Diagnosis not present

## 2016-09-24 DIAGNOSIS — D1801 Hemangioma of skin and subcutaneous tissue: Secondary | ICD-10-CM | POA: Diagnosis not present

## 2016-09-24 DIAGNOSIS — L570 Actinic keratosis: Secondary | ICD-10-CM | POA: Diagnosis not present

## 2016-10-18 DIAGNOSIS — H26491 Other secondary cataract, right eye: Secondary | ICD-10-CM | POA: Diagnosis not present

## 2016-11-26 ENCOUNTER — Other Ambulatory Visit: Payer: Self-pay | Admitting: Family Medicine

## 2016-11-26 DIAGNOSIS — Z1231 Encounter for screening mammogram for malignant neoplasm of breast: Secondary | ICD-10-CM

## 2016-12-25 ENCOUNTER — Ambulatory Visit: Payer: Medicare Other

## 2017-01-08 ENCOUNTER — Ambulatory Visit
Admission: RE | Admit: 2017-01-08 | Discharge: 2017-01-08 | Disposition: A | Discharge status: Home / Self Care | Payer: Medicare Other | Source: Ambulatory Visit | Attending: Family Medicine | Admitting: Family Medicine

## 2017-01-08 DIAGNOSIS — Z1231 Encounter for screening mammogram for malignant neoplasm of breast: Secondary | ICD-10-CM

## 2017-01-18 DIAGNOSIS — N183 Chronic kidney disease, stage 3 (moderate): Secondary | ICD-10-CM | POA: Diagnosis not present

## 2017-01-18 DIAGNOSIS — R7303 Prediabetes: Secondary | ICD-10-CM | POA: Diagnosis not present

## 2017-01-18 DIAGNOSIS — E782 Mixed hyperlipidemia: Secondary | ICD-10-CM | POA: Diagnosis not present

## 2017-01-18 DIAGNOSIS — Z23 Encounter for immunization: Secondary | ICD-10-CM | POA: Diagnosis not present

## 2017-01-18 DIAGNOSIS — I129 Hypertensive chronic kidney disease with stage 1 through stage 4 chronic kidney disease, or unspecified chronic kidney disease: Secondary | ICD-10-CM | POA: Diagnosis not present

## 2017-05-23 DIAGNOSIS — H04123 Dry eye syndrome of bilateral lacrimal glands: Secondary | ICD-10-CM | POA: Diagnosis not present

## 2017-05-23 DIAGNOSIS — H02831 Dermatochalasis of right upper eyelid: Secondary | ICD-10-CM | POA: Diagnosis not present

## 2017-05-23 DIAGNOSIS — Z961 Presence of intraocular lens: Secondary | ICD-10-CM | POA: Diagnosis not present

## 2017-05-23 DIAGNOSIS — H524 Presbyopia: Secondary | ICD-10-CM | POA: Diagnosis not present

## 2017-05-23 DIAGNOSIS — H26493 Other secondary cataract, bilateral: Secondary | ICD-10-CM | POA: Diagnosis not present

## 2017-06-20 DIAGNOSIS — Z96641 Presence of right artificial hip joint: Secondary | ICD-10-CM | POA: Diagnosis not present

## 2017-07-12 DIAGNOSIS — R7303 Prediabetes: Secondary | ICD-10-CM | POA: Diagnosis not present

## 2017-07-12 DIAGNOSIS — E782 Mixed hyperlipidemia: Secondary | ICD-10-CM | POA: Diagnosis not present

## 2017-07-12 DIAGNOSIS — M8580 Other specified disorders of bone density and structure, unspecified site: Secondary | ICD-10-CM | POA: Diagnosis not present

## 2017-07-12 DIAGNOSIS — I129 Hypertensive chronic kidney disease with stage 1 through stage 4 chronic kidney disease, or unspecified chronic kidney disease: Secondary | ICD-10-CM | POA: Diagnosis not present

## 2017-07-12 DIAGNOSIS — N183 Chronic kidney disease, stage 3 (moderate): Secondary | ICD-10-CM | POA: Diagnosis not present

## 2017-07-12 DIAGNOSIS — Z0000 Encounter for general adult medical examination without abnormal findings: Secondary | ICD-10-CM | POA: Diagnosis not present

## 2017-07-12 DIAGNOSIS — J301 Allergic rhinitis due to pollen: Secondary | ICD-10-CM | POA: Diagnosis not present

## 2017-07-12 DIAGNOSIS — K219 Gastro-esophageal reflux disease without esophagitis: Secondary | ICD-10-CM | POA: Diagnosis not present

## 2017-09-25 DIAGNOSIS — L570 Actinic keratosis: Secondary | ICD-10-CM | POA: Diagnosis not present

## 2017-09-25 DIAGNOSIS — Z85828 Personal history of other malignant neoplasm of skin: Secondary | ICD-10-CM | POA: Diagnosis not present

## 2017-09-25 DIAGNOSIS — D225 Melanocytic nevi of trunk: Secondary | ICD-10-CM | POA: Diagnosis not present

## 2017-09-25 DIAGNOSIS — D1801 Hemangioma of skin and subcutaneous tissue: Secondary | ICD-10-CM | POA: Diagnosis not present

## 2017-09-25 DIAGNOSIS — L821 Other seborrheic keratosis: Secondary | ICD-10-CM | POA: Diagnosis not present

## 2017-09-25 DIAGNOSIS — L814 Other melanin hyperpigmentation: Secondary | ICD-10-CM | POA: Diagnosis not present

## 2017-09-25 DIAGNOSIS — L820 Inflamed seborrheic keratosis: Secondary | ICD-10-CM | POA: Diagnosis not present

## 2017-12-18 ENCOUNTER — Other Ambulatory Visit: Payer: Self-pay | Admitting: Family Medicine

## 2017-12-18 DIAGNOSIS — Z1231 Encounter for screening mammogram for malignant neoplasm of breast: Secondary | ICD-10-CM

## 2018-01-15 DIAGNOSIS — Z23 Encounter for immunization: Secondary | ICD-10-CM | POA: Diagnosis not present

## 2018-01-15 DIAGNOSIS — N183 Chronic kidney disease, stage 3 (moderate): Secondary | ICD-10-CM | POA: Diagnosis not present

## 2018-01-15 DIAGNOSIS — E782 Mixed hyperlipidemia: Secondary | ICD-10-CM | POA: Diagnosis not present

## 2018-01-15 DIAGNOSIS — I129 Hypertensive chronic kidney disease with stage 1 through stage 4 chronic kidney disease, or unspecified chronic kidney disease: Secondary | ICD-10-CM | POA: Diagnosis not present

## 2018-01-15 DIAGNOSIS — R7303 Prediabetes: Secondary | ICD-10-CM | POA: Diagnosis not present

## 2018-01-16 ENCOUNTER — Ambulatory Visit
Admission: RE | Admit: 2018-01-16 | Discharge: 2018-01-16 | Disposition: A | Discharge status: Home / Self Care | Payer: Medicare Other | Source: Ambulatory Visit | Attending: Family Medicine | Admitting: Family Medicine

## 2018-01-16 DIAGNOSIS — Z1231 Encounter for screening mammogram for malignant neoplasm of breast: Secondary | ICD-10-CM | POA: Diagnosis not present

## 2018-09-07 IMAGING — MG DIGITAL SCREENING BILATERAL MAMMOGRAM WITH CAD
4 series · 4 of 4 positions shown · non-contrast
Comparison: Previous exam(s).

CLINICAL DATA: Screening.

EXAM:
DIGITAL SCREENING BILATERAL MAMMOGRAM WITH CAD

[L CC]
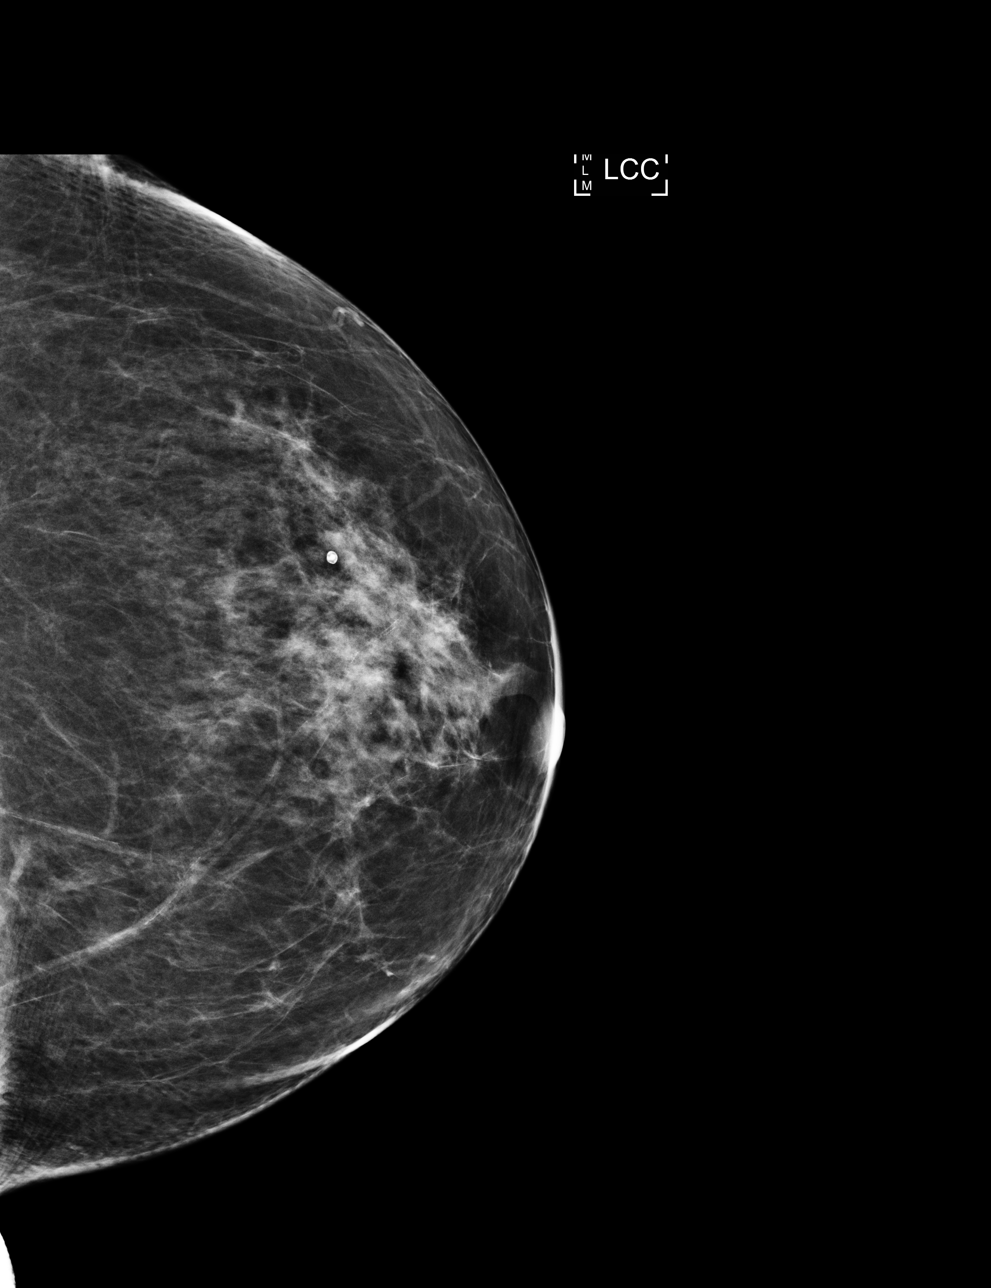

[R MLO]
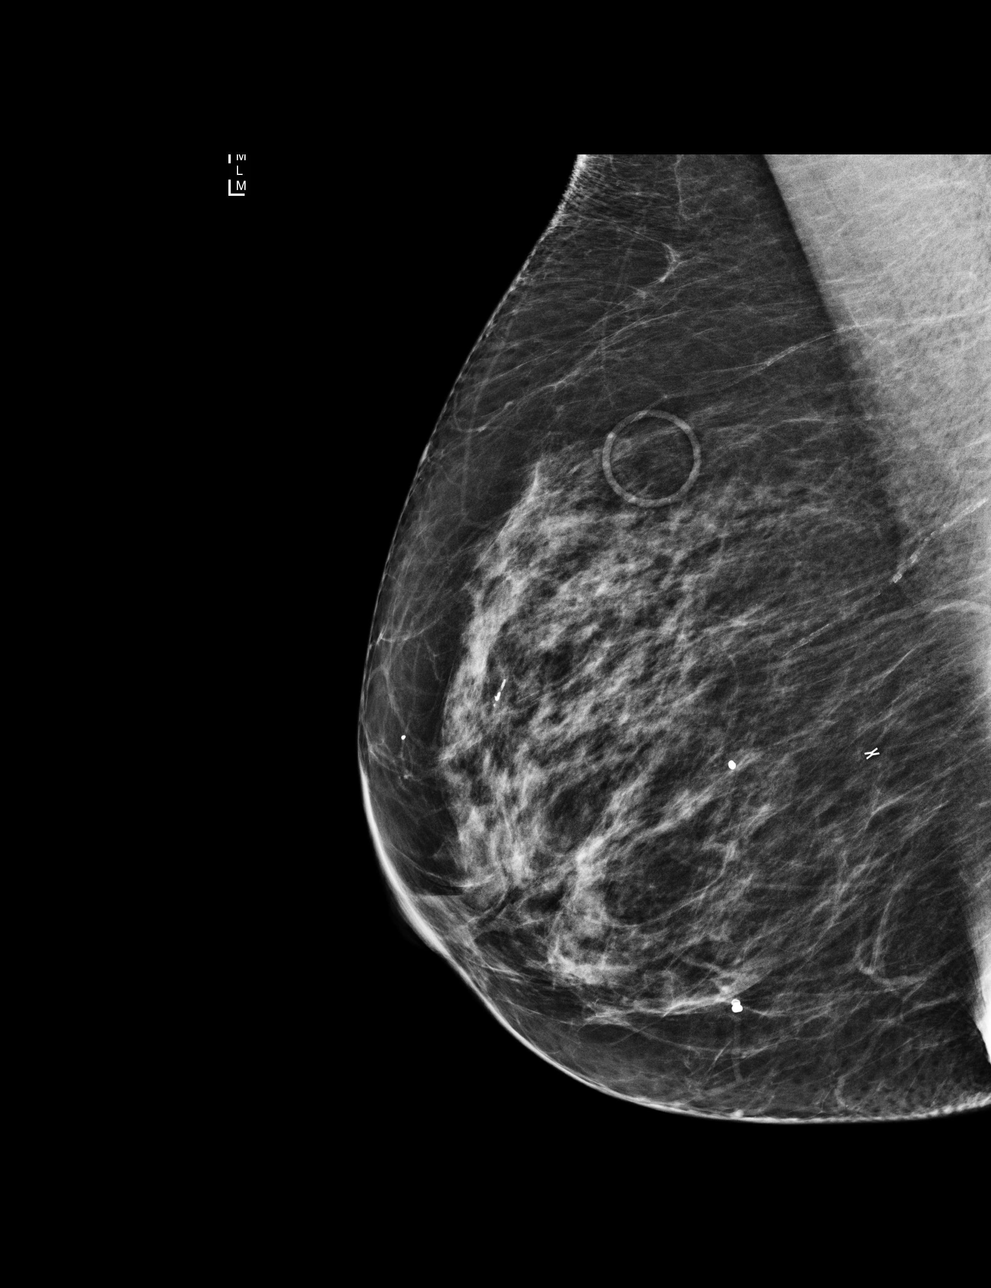

[R CC]
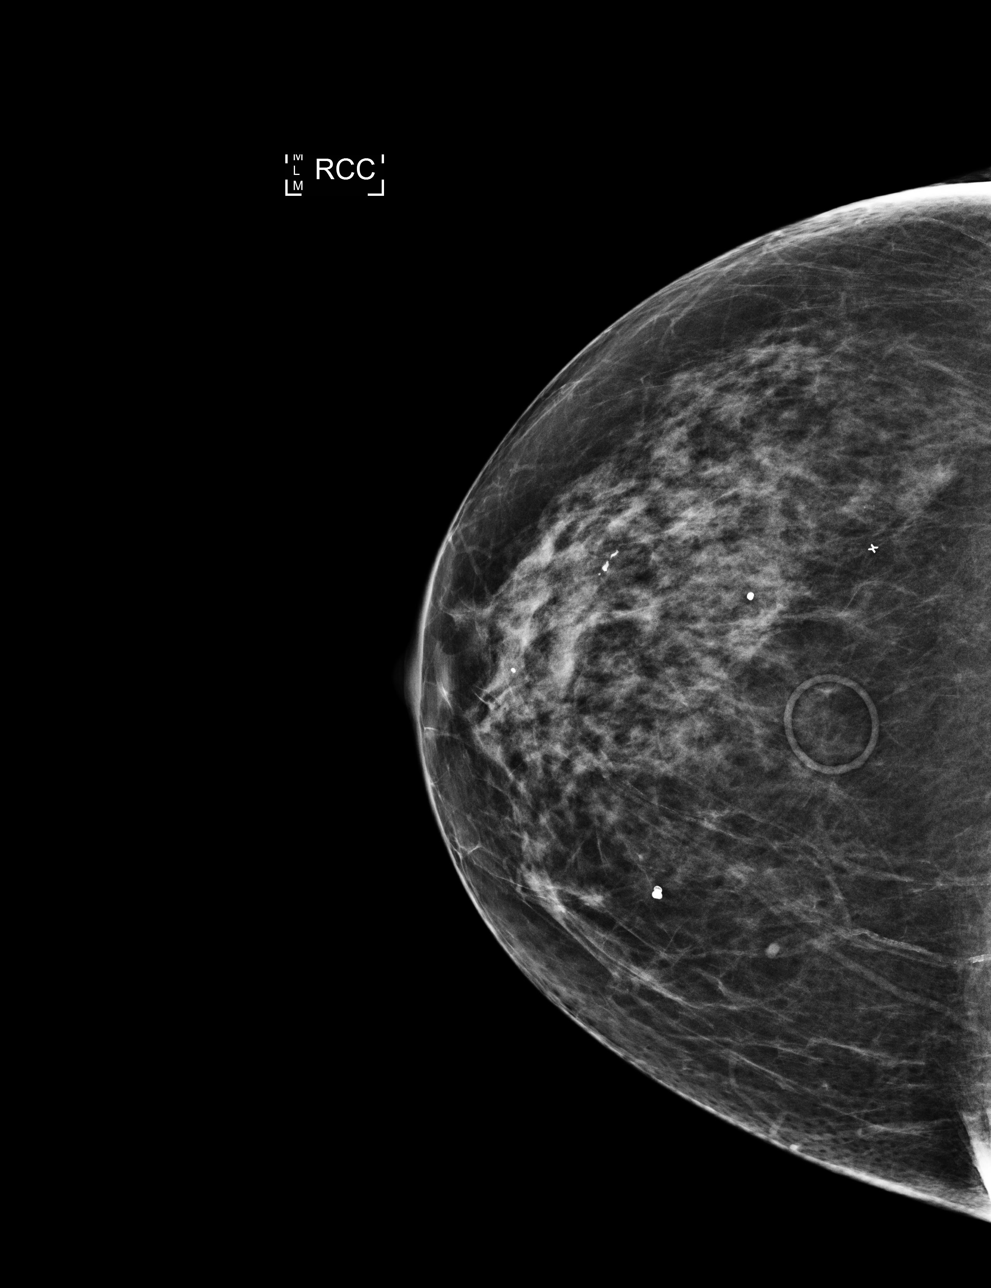

[L MLO]
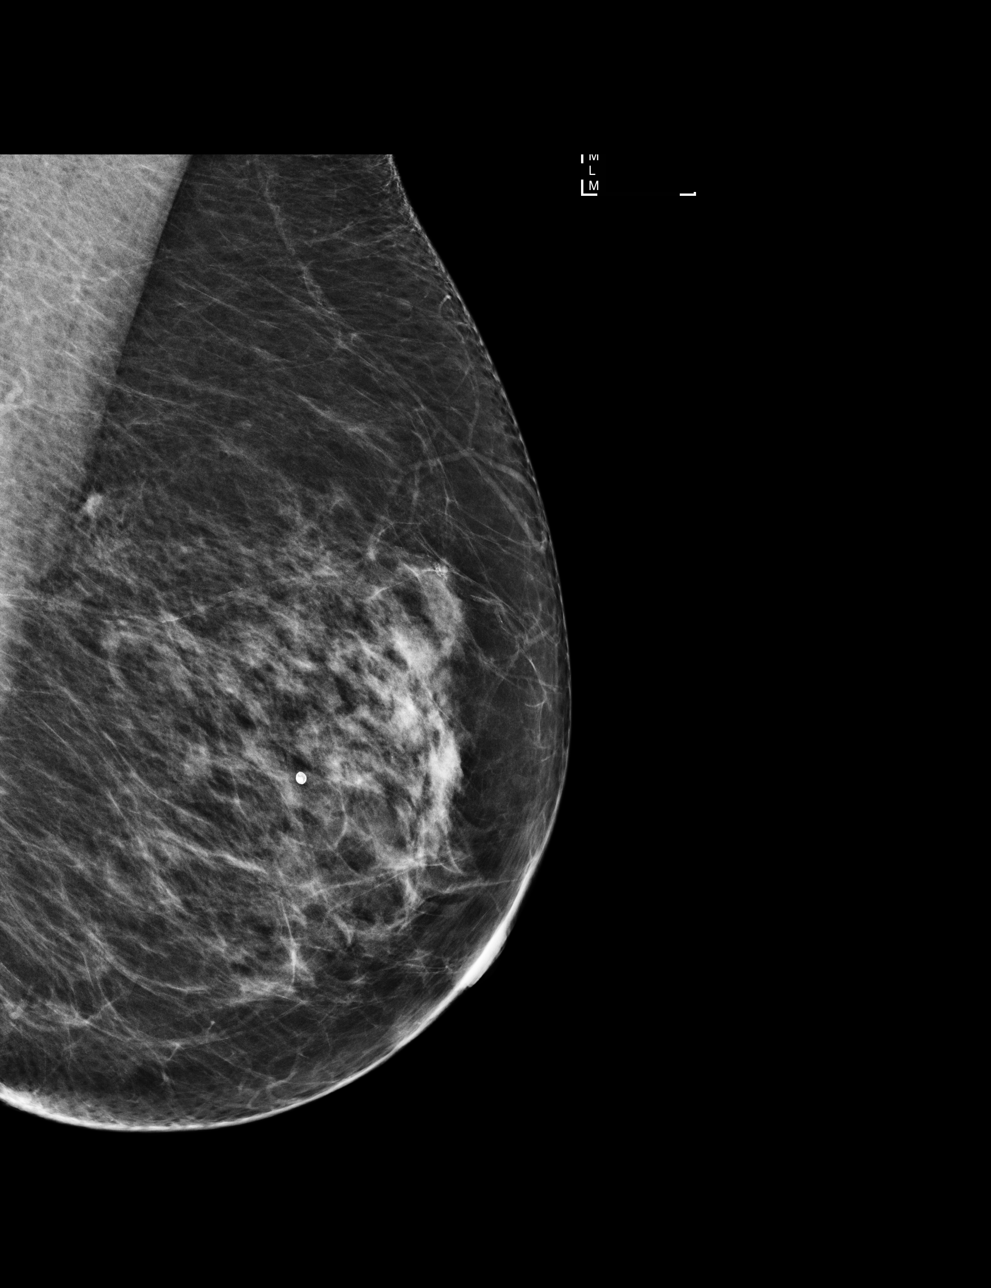

[4 of 4 positions shown; findings below may reference images not displayed]

ACR Breast Density Category c: The breast tissue is heterogeneously
dense, which may obscure small masses.
FINDINGS: There are no findings suspicious for malignancy. Images were
processed with CAD.
IMPRESSION: No mammographic evidence of malignancy. A result letter of this
screening mammogram will be mailed directly to the patient.

RECOMMENDATION:
Screening mammogram in one year. (Code:YJ-[id number])

BI-RADS CATEGORY  1: Negative.

## 2018-12-15 ENCOUNTER — Other Ambulatory Visit: Payer: Self-pay | Admitting: Family Medicine

## 2018-12-15 DIAGNOSIS — Z1231 Encounter for screening mammogram for malignant neoplasm of breast: Secondary | ICD-10-CM

## 2019-01-20 DIAGNOSIS — N183 Chronic kidney disease, stage 3 (moderate): Secondary | ICD-10-CM | POA: Diagnosis not present

## 2019-01-20 DIAGNOSIS — R7303 Prediabetes: Secondary | ICD-10-CM | POA: Diagnosis not present

## 2019-01-20 DIAGNOSIS — I129 Hypertensive chronic kidney disease with stage 1 through stage 4 chronic kidney disease, or unspecified chronic kidney disease: Secondary | ICD-10-CM | POA: Diagnosis not present

## 2019-01-20 DIAGNOSIS — E782 Mixed hyperlipidemia: Secondary | ICD-10-CM | POA: Diagnosis not present

## 2019-01-23 ENCOUNTER — Other Ambulatory Visit: Payer: Self-pay | Admitting: Family Medicine

## 2019-01-23 DIAGNOSIS — M8580 Other specified disorders of bone density and structure, unspecified site: Secondary | ICD-10-CM

## 2019-01-29 ENCOUNTER — Other Ambulatory Visit: Payer: Self-pay

## 2019-01-29 ENCOUNTER — Ambulatory Visit
Admission: RE | Admit: 2019-01-29 | Discharge: 2019-01-29 | Disposition: A | Discharge status: Home / Self Care | Payer: Medicare Other | Source: Ambulatory Visit | Attending: Family Medicine | Admitting: Family Medicine

## 2019-01-29 DIAGNOSIS — Z1231 Encounter for screening mammogram for malignant neoplasm of breast: Secondary | ICD-10-CM

## 2019-04-07 ENCOUNTER — Other Ambulatory Visit: Payer: Self-pay

## 2019-04-07 ENCOUNTER — Ambulatory Visit
Admission: RE | Admit: 2019-04-07 | Discharge: 2019-04-07 | Disposition: A | Discharge status: Home / Self Care | Payer: Medicare Other | Source: Ambulatory Visit | Attending: Family Medicine | Admitting: Family Medicine

## 2019-04-07 DIAGNOSIS — M8580 Other specified disorders of bone density and structure, unspecified site: Secondary | ICD-10-CM

## 2019-06-22 ENCOUNTER — Other Ambulatory Visit: Payer: Self-pay

## 2019-06-24 ENCOUNTER — Ambulatory Visit: Payer: Medicare PPO | Admitting: Internal Medicine

## 2019-06-24 ENCOUNTER — Encounter: Payer: Self-pay | Admitting: Internal Medicine

## 2019-06-24 ENCOUNTER — Other Ambulatory Visit: Payer: Self-pay

## 2019-06-24 ENCOUNTER — Telehealth: Payer: Self-pay

## 2019-06-24 VITALS — BP 136/70 | HR 86 | Ht 62.75 in | Wt 138.0 lb

## 2019-06-24 DIAGNOSIS — M810 Age-related osteoporosis without current pathological fracture: Secondary | ICD-10-CM | POA: Diagnosis not present

## 2019-06-24 DIAGNOSIS — M85852 Other specified disorders of bone density and structure, left thigh: Secondary | ICD-10-CM | POA: Insufficient documentation

## 2019-06-24 LAB — BASIC METABOLIC PANEL
BUN: 18 mg/dL (ref 6–23)
CO2: 29 mEq/L (ref 19–32)
Calcium: 9.7 mg/dL (ref 8.4–10.5)
Chloride: 102 mEq/L (ref 96–112)
Creatinine, Ser: 1.01 mg/dL (ref 0.40–1.20)
GFR: 52.28 mL/min — ABNORMAL LOW (ref >60.00)
Glucose, Bld: 87 mg/dL (ref 70–99)
Potassium: 3.8 mEq/L (ref 3.5–5.1)
Sodium: 139 mEq/L (ref 135–145)

## 2019-06-24 LAB — TSH: TSH: 3.92 u[IU]/mL (ref 0.35–4.50)

## 2019-06-24 LAB — VITAMIN D 25 HYDROXY (VIT D DEFICIENCY, FRACTURES): VITD: 25.34 ng/mL — ABNORMAL LOW (ref 30.00–100.00)

## 2019-06-24 NOTE — Progress Notes (Signed)
Patient ID: Desiree Caldwell, female   DOB: March 22, 1934, 86 y.o.   MRN: [id number]   This visit occurred during the SARS-CoV-2 public health emergency.  Safety protocols were in place, including screening questions prior to the visit, additional usage of staff PPE, and extensive cleaning of exam room while observing appropriate contact time as indicated for disinfecting solutions.   HPI  Desiree Caldwell is a 86 y.o.-year-old delightful female, referred by her PCP, Dr. Brigitte Pulse, for management of clinical osteoporosis.  Pt was dx with osteopenia on bone mineral density in 2014.  Her 10-year fracture risk is high.  I reviewed pt's latest DXA scan reports and images: Date L1-L4 T score FN T score 33% distal Radius FRAX score  04/07/2019 ([facility])  0.0 RFN: n/a (history of hip replacement) LFN: -2.4 n/a 10-year MOF: 26.5% 10-year hip fracture risk: 8.9%   She denies fractures except for a left 5th metatarsal fracture in 2011 - misplaced foot when coming down stairs.  No recent falls.    She has a history of right total hip replacement in 2016.  No dizziness/vertigo/orthostasis/poor vision. Balance not great after her THR.  Previous OP treatments:  - [facility] 2014-2016 She declined medications afterwards.  No h/o vitamin D deficiency. No available vit D levels for review: No results found for: VD25OH  Pt is not on: - calcium - vitamin D  She likes to go to the beach often.  Diet: - Breakfast: Toast; oatmeal; cereal - Lunch: Half a sandwich or occasionally eating out - Dinner: Meat with vegetables or vegetarian meal - Snacks: Usually 1 a day: Cheese, fruit, nuts  No weight bearing exercises, but she goes to [facility] where she walks on the treadmill and uses the stationary bike.   She does not take high vitamin A doses.  Menopause was at 86 y/o.  She has a history of TAH (7479, at 86 years old) and BSO (in 4322, at 86 years old). She was on THR in the past.  No FH of  osteoporosis.  No h/o hyper/hypocalcemia or hyperparathyroidism. No h/o kidney stones. 01/14/2019: Calcium 9.4 (8.6-10.3) Lab Results  Component Value Date   CALCIUM 8.8 06/23/2014   CALCIUM 8.8 06/22/2014   CALCIUM 9.7 06/14/2014   No h/o thyrotoxicosis.  No available TSH recent levels:  No results found for: TSH   + Stage II-III hypertensive CKD. Last BUN/Cr: 01/14/2019: 12/0.94, GFR 67 Lab Results  Component Value Date   BUN 14 06/23/2014   CREATININE 0.89 06/23/2014   She also has a history of HTN, HL, diabetes, GERD.  Daughter with melanoma and ThyCA.  ROS: Constitutional: no weight gain, no weight loss, no fatigue, no subjective hyperthermia, no subjective hypothermia, no nocturia Eyes: no blurry vision, no xerophthalmia ENT: no sore throat, no nodules palpated in neck, no dysphagia, no odynophagia, no hoarseness, no tinnitus, no hypoacusis Cardiovascular: no CP, no SOB, no palpitations, no leg swelling Respiratory: no cough, no SOB, no wheezing Gastrointestinal: no N, no V, no D, no C, + acid reflux Musculoskeletal: no muscle, no joint aches, but stiffness when she first gets up in the morning Skin: no rash, no hair loss Neurological: no tremors, no numbness or tingling/no dizziness/no HAs, + occasional sciatica pain Psychiatric: no depression, no anxiety  I reviewed pt's medications, allergies, PMH, social hx, family hx, and changes were documented in the history of present illness. Otherwise, unchanged from my initial visit note.  Past Medical History:  Diagnosis Date  . Arthritis   .  Complication of anesthesia    SLOW TO WAKE UP  . Difficulty sleeping    DUE TO PAIN  . Diverticulosis   . GERD (gastroesophageal reflux disease)   . Hyperlipidemia   . Hypertension   . Osteopenia   . Seasonal allergies    Past Surgical History:  Procedure Laterality Date  . ABDOMINAL HYSTERECTOMY     > 20 YRS AGO   . BILATERAL OOPHORECTOMY  1991  . BREAST BIOPSY Right      benign  . TOTAL HIP ARTHROPLASTY Right 06/21/2014   Procedure: RIGHT TOTAL HIP ARTHROPLASTY ANTERIOR APPROACH;  Surgeon: Gearlean Alf, MD;  Location: [facility];  Service: Orthopedics;  Laterality: Right;   Social History   Socioeconomic History  . Marital status: Divorced    Spouse name: Not on file  . Number of children: 1- daughter - 49 y/o in 06/2019  . Years of education: Not on file  . Highest education level: Not on file  Occupational History  .  Retired Art therapist  Tobacco Use  . Smoking status: Never Smoker  Substance and Sexual Activity  . Alcohol use: Yes    Comment: RARE  . Drug use: No  . Sexual activity: Not on file  Other Topics Concern  . Not on file  Social History Narrative  . Not on file   Social Determinants of Health   Financial Resource Strain:   . Difficulty of Paying Living Expenses: Not on file  Food Insecurity:   . Worried About Charity fundraiser in the Last Year: Not on file  . Ran Out of Food in the Last Year: Not on file  Transportation Needs:   . Lack of Transportation (Medical): Not on file  . Lack of Transportation (Non-Medical): Not on file  Physical Activity:   . Days of Exercise per Week: Not on file  . Minutes of Exercise per Session: Not on file  Stress:   . Feeling of Stress : Not on file  Social Connections:   . Frequency of Communication with Friends and Family: Not on file  . Frequency of Social Gatherings with Friends and Family: Not on file  . Attends Religious Services: Not on file  . Active Member of Clubs or Organizations: Not on file  . Attends Archivist Meetings: Not on file  . Marital Status: Not on file  Intimate Partner Violence:   . Fear of Current or Ex-Partner: Not on file  . Emotionally Abused: Not on file  . Physically Abused: Not on file  . Sexually Abused: Not on file   Current Outpatient Medications on File Prior to Visit  Medication Sig Dispense Refill  . acetaminophen (TYLENOL) 325 MG  tablet Take 2 tablets (650 mg total) by mouth every 6 (six) hours as needed for mild pain (or Fever >/= 101). 40 tablet 0  . bisacodyl (DULCOLAX) 10 MG suppository Place 1 suppository (10 mg total) rectally daily as needed for moderate constipation. 12 suppository 0  . docusate sodium (COLACE) 100 MG capsule Take 1 capsule (100 mg total) by mouth 2 (two) times daily. 10 capsule 0  . fluticasone (FLONASE) 50 MCG/ACT nasal spray Place 2 sprays into both nostrils every morning.    . hydrochlorothiazide (HYDRODIURIL) 25 MG tablet Take 12.5 mg by mouth every morning.    . methocarbamol (ROBAXIN) 500 MG tablet Take 1 tablet (500 mg total) by mouth every 6 (six) hours as needed for muscle spasms. 80 tablet 0  .  metoCLOPramide (REGLAN) 5 MG tablet Take 1 tablet (5 mg total) by mouth every 8 (eight) hours as needed for nausea (if ondansetron (ZOFRAN) ineffective.). 40 tablet 0  . omeprazole (PRILOSEC) 20 MG capsule Take 20 mg by mouth every morning.    . ondansetron (ZOFRAN) 4 MG tablet Take 1 tablet (4 mg total) by mouth every 6 (six) hours as needed for nausea. 40 tablet 0  . oxyCODONE (OXY IR/ROXICODONE) 5 MG immediate release tablet Take 1-2 tablets (5-10 mg total) by mouth every 3 (three) hours as needed for moderate pain, severe pain or breakthrough pain. 90 tablet 0  . polyethylene glycol (MIRALAX / GLYCOLAX) packet Take 17 g by mouth daily as needed for mild constipation. 14 each 0  . rivaroxaban (XARELTO) 10 MG TABS tablet Take 1 tablet (10 mg total) by mouth daily with breakfast. Take Xarelto for two and a half more weeks, then discontinue Xarelto. Once the patient has completed the Xarelto, they may resume the 81 mg Aspirin. 19 tablet 0  . simvastatin (ZOCOR) 80 MG tablet Take 80 mg by mouth at bedtime.    . traMADol (ULTRAM) 50 MG tablet Take 1-2 tablets (50-100 mg total) by mouth every 6 (six) hours as needed (mild pain). 60 tablet 1   No current facility-administered medications on file prior to  visit.   No Known Allergies Family History  Problem Relation Age of Onset  . Breast cancer Neg Hx     PE: BP 136/70   Pulse 86   Ht 5' 2.75" (1.594 m) Comment: Measured today without shoes  Wt 138 lb (62.6 kg)   SpO2 99%   BMI 24.64 kg/m  Wt Readings from Last 3 Encounters:  06/24/19 138 lb (62.6 kg)  06/29/14 162 lb (73.5 kg)  06/25/14 162 lb (73.5 kg)   Constitutional: Normal weight, in NAD. No kyphosis. Eyes: PERRLA, EOMI, no exophthalmos ENT: moist mucous membranes, no thyromegaly, no cervical lymphadenopathy Cardiovascular: RRR, No MRG Respiratory: CTA B Gastrointestinal: abdomen soft, NT, ND, BS+ Musculoskeletal: no deformities, strength intact in all 4 Skin: moist, warm, no rashes Neurological: no tremor with outstretched hands, DTR normal in all 4  Assessment: 1.  Clinical osteoporosis  Plan: 1.  Clinical osteoporosis - likely postmenopausal/age-related but also possibly due to early TAH (although she had BSO only after she became menopausal, no FH of OP - Discussed about increased risk of fracture, depending on the T score, greatly increased when the T score is lower than -2.5, but it is actually a continuum and -2.5 should not be regarded as an absolute threshold. We reviewed her latest DXA scan report together, and I explained that based on the T scores, she has an increased risk for fractures.  - we reviewed her dietary and supplemental calcium and vitamin D intake.  She appears to get 1000-1200 mg of calcium daily from diet and I will check vit D today to see if she needs supplementation. - discussed fall precautions   - given handout from [facility]: weight bearing exercises - advised to do this every day or at least 5/7 days - I also recommended the [facility] - for skeletal loading. Given brochure and explained benefits. - Reviewing her diet, she is getting a good amount of protein in her diet. The recommended daily protein  intake is ~0.8 g per kilogram per day. A diet low in proteins can exacerbate osteoporosis.  She is not smoking or drinking>2 drinks of alcohol a day. -  I recommended the following book for the concept of low acid eating:  - We discussed about the different medication classes, benefits and side effects (including atypical fractures and ONJ - no dental workup in progress or planned).  - As she has a history of GERD and refuses p.o. medications, I explained that first options are sq denosumab (Prolia) sq every 6 months for 6-10 years and zoledronic acid (Reclast) iv 1x a year for 1-2 years. I would use Teriparatide/Abaloparatide (which are daily subcu medication) or Romosozumab (monthly) as a last resort.  She agrees to to start Prolia. - Pt was given reading information about Prolia, and I explained the mechanism of action and expected benefits.  - Will check the following labs today: Orders Placed This Encounter  Procedures  . TSH  . VITAMIN D 25 Hydroxy (Vit-D Deficiency, Fractures)  . Basic metabolic panel  - if labs normal, will arrange for a Prolia inj - will check a new DXA scan in 1.5-2 years after starting Prolia -  I explained that the first indication that the treatment is working is her not having anymore fractures. DEXA scan changes are secondary: unchanged or slightly higher T-scores are desirable - will see pt back in a year  Component     Latest Ref Rng & Units 06/24/2019  Sodium     135 - 145 mEq/L 139  Potassium     3.5 - 5.1 mEq/L 3.8  Chloride     96 - 112 mEq/L 102  CO2     19 - 32 mEq/L 29  Glucose     70 - 99 mg/dL 87  BUN     6 - 23 mg/dL 18  Creatinine     0.40 - 1.20 mg/dL 1.01  GFR     >60.00 mL/min 52.28 (L)  Calcium     8.4 - 10.5 mg/dL 9.7  TSH     0.35 - 4.50 uIU/mL 3.92  VITD     30.00 - 100.00 ng/mL 25.34 (L)   Vitamin D level is slightly low so we will advise her to start 2000 units vitamin D daily.  We will repeat her vitamin D level in 2  months.  In the meantime, we can go ahead with the Prolia preauthorization.  Philemon Kingdom, MD PhD [facility]

## 2019-06-24 NOTE — Patient Instructions (Addendum)
Please stop at the lab.  Please come back for a follow-up appointment in 1 year.  How Can I Prevent Falls? Men and women with osteoporosis need to take care not to fall down. Falls can break bones. Some reasons people fall are: Poor vision  Poor balance  Certain diseases that affect how you walk  Some types of medicine, such as sleeping pills.  Some tips to help prevent falls outdoors are: Use a cane or walker  Wear rubber-soled shoes so you don't slip  Walk on grass when sidewalks are slippery  In winter, put salt or kitty litter on icy sidewalks.  Some ways to help prevent falls indoors are: Keep rooms free of clutter, especially on floors  Use plastic or carpet runners on slippery floors  Wear low-heeled shoes that provide good support  Do not walk in socks, stockings, or slippers  Be sure carpets and area rugs have skid-proof backs or are tacked to the floor  Be sure stairs are well lit and have rails on both sides  Put grab bars on bathroom walls near tub, shower, and toilet  Use a rubber bath mat in the shower or tub  Keep a flashlight next to your bed  Use a sturdy step stool with a handrail and wide steps  Add more lights in rooms (and night lights) Buy a cordless phone to keep with you so that you don't have to rush to the phone       when it rings and so that you can call for help if you fall.   (adapted from http://www.niams.NightlifePreviews.se)  Please check out the following book about best diet for bone health:   Exercise for Strong Bones (from [employer]) There are two types of exercises that are important for building and maintaining bone density:  weight-bearing and muscle-strengthening exercises. Weight-bearing Exercises These exercises include activities that make you move against gravity while staying upright. Weight-bearing exercises can be high-impact or low-impact. High-impact weight-bearing  exercises help build bones and keep them strong. If you have broken a bone due to osteoporosis or are at risk of breaking a bone, you may need to avoid high-impact exercises. If you're not sure, you should check with your healthcare provider. Examples of high-impact weight-bearing exercises are: . Dancing . Doing high-impact aerobics . Hiking . Jogging/running . Jumping Rope . Stair climbing . Tennis Low-impact weight-bearing exercises can also help keep bones strong and are a safe alternative if you cannot do high-impact exercises. Examples of low-impact weight-bearing exercises are: . Using elliptical training machines . Doing low-impact aerobics . Using stair-step machines . Fast walking on a treadmill or outside Muscle-Strengthening Exercises These exercises include activities where you move your body, a weight or some other resistance against gravity. They are also known as resistance exercises and include: . Lifting weights . Using elastic exercise bands . Using weight machines . Lifting your own body weight . Functional movements, such as standing and rising up on your toes Yoga and Pilates can also improve strength, balance and flexibility. However, certain positions may not be safe for people with osteoporosis or those at increased risk of broken bones. For example, exercises that have you bend forward may increase the chance of breaking a bone in the spine. A physical therapist should be able to help you learn which exercises are safe and appropriate for you. Non-Impact Exercises Non-impact exercises can help you to improve balance, posture and how well you move in everyday activities. These  exercises can also help to increase muscle strength and decrease the risk of falls and broken bones. Some of these exercises include: . Balance exercises that strengthen your legs and test your balance, such as Tai Chi, can decrease your risk of falls. . Posture exercises that improve your  posture and reduce rounded or "sloping" shoulders can help you decrease the chance of breaking a bone, especially in the spine. . Functional exercises that improve how well you move can help you with everyday activities and decrease your chance of falling and breaking a bone. For example, if you have trouble getting up from a chair or climbing stairs, you should do these activities as exercises. A physical therapist can teach you balance, posture and functional exercises. Starting a New Exercise Program If you haven't exercised regularly for a while, check with your healthcare provider before beginning a new exercise program--particularly if you have health problems such as heart disease, diabetes or high blood pressure. If you're at high risk of breaking a bone, you should work with a physical therapist to develop a safe exercise program. Once you have your healthcare provider's approval, start slowly. If you've already broken bones in the spine because of osteoporosis, be very careful to avoid activities that require reaching down, bending forward, rapid twisting motions, heavy lifting and those that increase your chance of a fall. As you get started, your muscles may feel sore for a day or two after you exercise. If soreness lasts longer, you may be working too hard and need to ease up. Exercises should be done in a pain-free range of motion. How Much Exercise Do You Need? Weight-bearing exercises 30 minutes on most days of the week. Do a 30-minutesession or multiple sessions spread out throughout the day. The benefits to your bones are the same.   Muscle-strengthening exercises Two to three days per week. If you don't have much time for strengthening/resistance training, do small amounts at a time. You can do just one body part each day. For example do arms one day, legs the next and trunk the next. You can also spread these exercises out during your normal day.  Balance, posture and functional  exercises Every day or as often as needed. You may want to focus on one area more than the others. If you have fallen or lose your balance, spend time doing balance exercises. If you are getting rounded shoulders, work more on posture exercises. If you have trouble climbing stairs or getting up from the couch, do more functional exercises. You can also perform these exercises at one time or spread them during your day. Work with a phyiscal therapist to learn the right exercises for you.    Denosumab: Patient drug information (Up-to-date) Copyright [phone] [place] rights reserved.  Brand Names: [place]  ProliaDelton See What is this drug used for?  .It is used to treat soft, brittle bones (osteoporosis).  .It is used for bone growth.  .It is used when treating some cancers.  .It may be given to you for other reasons. Talk with the doctor. What do I need to tell my doctor BEFORE I take this drug?  All products:  .If you have an allergy to denosumab or any other part of this drug.  .If you are allergic to any drugs like this one, any other drugs, foods, or other substances. Tell your doctor about the allergy and what signs you had, like rash; hives; itching; shortness of breath; wheezing; cough; swelling  of face, lips, tongue, or throat; or any other signs.  .If you have low calcium levels.  ProliaT:  .If you are pregnant or may be pregnant. Do not take this drug if you are pregnant.  This is not a list of all drugs or health problems that interact with this drug.  Tell your doctor and pharmacist about all of your drugs (prescription or OTC, natural products, vitamins) and health problems. You must check to make sure that it is safe for you to take this drug with all of your drugs and health problems. Do not start, stop, or change the dose of any drug without checking with your doctor. What are some things I need to know or do while I take this drug?  All products:  .Tell dentists,  surgeons, and other doctors that you use this drug.  .This drug may raise the chance of a broken leg. Talk with your doctor.  .Have your blood work checked. Talk with your doctor.  .Have a bone density test. Talk with your doctor.  .Take calcium and vitamin D as you were told by your doctor.  .Have a dental exam before starting this drug.  .Take good care of your teeth. See a dentist often.  .If you smoke, talk with your doctor.  .Do not give to a child. Talk with your doctor.  .Tell your doctor if you are breast-feeding. You will need to talk about any risks to your baby.  Xgeva:  .This drug may cause harm to the unborn baby if you take it while you are pregnant. If you get pregnant while taking this drug, call your doctor right away.  ProliaT:  .Very bad infections have been reported with use of this drug. If you have any infection, are taking antibiotics now or in the recent past, or have many infections, talk with your doctor.  .You may have more chance of getting an infection. Wash hands often. Stay away from people with infections, colds, or flu.  .Use birth control that you can trust to prevent pregnancy while taking this drug.  .If you are a man and your sex partner is pregnant or gets pregnant at any time while you are being treated, talk with your doctor. What are some side effects that I need to call my doctor about right away?  WARNING/CAUTION: Even though it may be rare, some people may have very bad and sometimes deadly side effects when taking a drug. Tell your doctor or get medical help right away if you have any of the following signs or symptoms that may be related to a very bad side effect:  All products:  .Signs of an allergic reaction, like rash; hives; itching; red, swollen, blistered, or peeling skin with or without fever; wheezing; tightness in the chest or throat; trouble breathing or talking; unusual hoarseness; or swelling of the mouth, face, lips, tongue, or throat.    .Signs of low calcium levels like muscle cramps or spasms, numbness and tingling, or seizures.  .Mouth sores.  .Any new or strange groin, hip, or thigh pain.  .This drug may cause jawbone problems. The chance may be higher the longer you take this drug. The chance may be higher if you have cancer, dental problems, dentures that do not fit well, anemia, blood clotting problems, or an infection. The chance may also be higher if you are having dental work or if you are getting chemo, some steroid drugs, or radiation. Call your doctor right away if you  have jaw swelling or pain.  Xgeva:  .Not hungry.  .Muscle pain or weakness.  .Seizures.  .Shortness of breath.  ProliaT:  .Signs of infection. These include a fever of 100.75F (38C) or higher, chills, very bad sore throat, ear or sinus pain, cough, more sputum or change in color of sputum, pain with passing urine, mouth sores, wound that will not heal, or anal itching or pain.  .Signs of a pancreas problem (pancreatitis) like very bad stomach pain, very bad back pain, or very bad upset stomach or throwing up.  .Chest pain.  .A heartbeat that does not feel normal.  .Very bad skin irritation.  .Feeling very tired or weak.  .Bladder pain or pain when passing urine or change in how much urine is passed.  .Passing urine often.  .Swelling in the arms or legs. What are some other side effects of this drug?  All drugs may cause side effects. However, many people have no side effects or only have minor side effects. Call your doctor or get medical help if any of these side effects or any other side effects bother you or do not go away:  Xgeva:  .Feeling tired or weak.  Marland KitchenHeadache.  Marland KitchenUpset stomach or throwing up.  .Loose stools (diarrhea).  .Cough.  ProliaT:  .Back pain.  .Muscle or joint pain.  .Sore throat.  .Runny nose.  .Pain in arms or legs.  These are not all of the side effects that may occur. If you have questions about side effects,  call your doctor. Call your doctor for medical advice about side effects.  You may report side effects to your national health agency. How is this drug best taken?  Use this drug as ordered by your doctor. Read and follow the dosing on the label closely.  .It is given as a shot into the fatty part of the skin. What do I do if I miss a dose?  .Call the doctor to find out what to do. How do I store and/or throw out this drug?  Marland KitchenThis drug will be given to you in a hospital or doctor's office. You will not store it at home.  Marland KitchenKeep all drugs out of the reach of children and pets.  .Check with your pharmacist about how to throw out unused drugs.  General drug facts  .If your symptoms or health problems do not get better or if they become worse, call your doctor.  .Do not share your drugs with others and do not take anyone else's drugs.  Marland KitchenKeep a list of all your drugs (prescription, natural products, vitamins, OTC) with you. Give this list to your doctor.  .Talk with the doctor before starting any new drug, including prescription or OTC, natural products, or vitamins.  .Some drugs may have another patient information leaflet. If you have any questions about this drug, please talk with your doctor, pharmacist, or other health care provider.  .If you think there has been an overdose, call your poison control center or get medical care right away. Be ready to tell or show what was taken, how much, and when it happened.

## 2019-06-24 NOTE — Telephone Encounter (Signed)
-----   Message from Philemon Kingdom, MD sent at 06/24/2019  1:13 PM EST ----- [name], can you please call pt: Labs are ok, however, Vitamin D level is slightly low so I would suggest to start 2000 units vitamin D daily.  We will need to repeat her vitamin D level in 2 months (lab is in).  In the meantime, we can go ahead with the Prolia preauthorization.  Lattie Haw, can you please submit her to the Prolia portal? Thank you!

## 2019-06-26 NOTE — Telephone Encounter (Signed)
Notified patient of message from Dr. Cruzita Lederer, patient expressed understanding and agreement. No further questions.  Routing to [employer] for [employer].

## 2019-07-01 NOTE — Telephone Encounter (Signed)
Routing to stephanie

## 2019-07-02 NOTE — Telephone Encounter (Signed)
Pt has been submitted to the portal just awaiting SOB

## 2019-08-13 ENCOUNTER — Telehealth: Payer: Self-pay

## 2019-08-13 NOTE — Telephone Encounter (Signed)
Error

## 2019-08-27 ENCOUNTER — Other Ambulatory Visit: Payer: Self-pay

## 2019-08-27 ENCOUNTER — Other Ambulatory Visit (INDEPENDENT_AMBULATORY_CARE_PROVIDER_SITE_OTHER): Payer: Medicare PPO

## 2019-08-27 DIAGNOSIS — M85852 Other specified disorders of bone density and structure, left thigh: Secondary | ICD-10-CM

## 2019-08-27 DIAGNOSIS — M810 Age-related osteoporosis without current pathological fracture: Secondary | ICD-10-CM

## 2019-08-27 LAB — VITAMIN D 25 HYDROXY (VIT D DEFICIENCY, FRACTURES): VITD: 61.97 ng/mL (ref 30.00–100.00)

## 2019-08-28 ENCOUNTER — Telehealth: Payer: Self-pay

## 2019-08-28 NOTE — Telephone Encounter (Signed)
Notified patient of message from Dr. Gherghe, patient expressed understanding and agreement. No further questions.  

## 2019-08-28 NOTE — Telephone Encounter (Signed)
Left message for patient to return our call at [phone].  

## 2019-08-28 NOTE — Telephone Encounter (Signed)
-----   Message from Philemon Kingdom, MD sent at 08/27/2019  5:09 PM EDT ----- [name], can you please call pt:  Vitamin D level is back and this is excellent!  She can continue on 2000 units of vitamin D daily.

## 2019-09-07 ENCOUNTER — Telehealth: Payer: Self-pay

## 2019-09-07 NOTE — Telephone Encounter (Signed)
PA initiated via [email or web address] for Prolia 60mg /mL injection.  [facility] Key: [id number] - PA Case ID: [id number] Need help? Call us at [phone] Status Sent to [place] 60MG /ML syringes Form Nurse, adult and Medical Benefit PA Form

## 2019-09-08 NOTE — Telephone Encounter (Signed)
Received notification from [email or web address] stating that the patient has been approved for Prolia.  Desiree Caldwell Key: [id number]  PA Case ID: [id number] Outcome: Approvedon April 26 PA Case: [id number],  Status: Approved,  Coverage Starts on: 09/07/2019 12:00:00 AM, Coverage Ends on: 05/13/2020 12:00:00 AM.  Prolia 60MG /ML syringes   Called pt and left voicemail requesting a call back in order to schedule injection.

## 2019-09-09 NOTE — Telephone Encounter (Signed)
Patient requests to be called at ph# [phone] re: Prolia injection

## 2019-09-09 NOTE — Telephone Encounter (Signed)
Called pt and requested a callback to inform her that, according to the [employer] paperwork that we have from her insurance company, the pt has a $0 copay and does not owe anything for medication or injection.

## 2019-09-09 NOTE — Telephone Encounter (Signed)
Patient would like [place] to call her back to let her know what the copay would be for prolia.

## 2019-09-11 NOTE — Telephone Encounter (Signed)
Pt returned phone call to front desk and all she wanted to do was schedule Prolia injection. Pt was scheduled.

## 2019-09-17 ENCOUNTER — Ambulatory Visit (INDEPENDENT_AMBULATORY_CARE_PROVIDER_SITE_OTHER): Payer: Medicare PPO

## 2019-09-17 ENCOUNTER — Other Ambulatory Visit: Payer: Self-pay

## 2019-09-17 DIAGNOSIS — M810 Age-related osteoporosis without current pathological fracture: Secondary | ICD-10-CM | POA: Diagnosis not present

## 2019-09-17 DIAGNOSIS — M85852 Other specified disorders of bone density and structure, left thigh: Secondary | ICD-10-CM | POA: Diagnosis not present

## 2019-09-17 MED ORDER — DENOSUMAB 60 MG/ML ~~LOC~~ SOSY
60.0000 mg | PREFILLED_SYRINGE | Freq: Once | SUBCUTANEOUS | Status: AC
  Administered 2019-09-17: 60 mg via SUBCUTANEOUS

## 2019-09-17 NOTE — Progress Notes (Signed)
Per orders of Dr. Cruzita Lederer injection of Prolia given today by [name], Physiological scientist . Patient tolerated injection well. Patient observed for 20 minutes after injection without incident.

## 2019-11-17 DIAGNOSIS — D225 Melanocytic nevi of trunk: Secondary | ICD-10-CM | POA: Diagnosis not present

## 2019-11-17 DIAGNOSIS — L814 Other melanin hyperpigmentation: Secondary | ICD-10-CM | POA: Diagnosis not present

## 2019-11-17 DIAGNOSIS — L821 Other seborrheic keratosis: Secondary | ICD-10-CM | POA: Diagnosis not present

## 2019-11-17 DIAGNOSIS — Z85828 Personal history of other malignant neoplasm of skin: Secondary | ICD-10-CM | POA: Diagnosis not present

## 2019-11-17 DIAGNOSIS — L578 Other skin changes due to chronic exposure to nonionizing radiation: Secondary | ICD-10-CM | POA: Diagnosis not present

## 2019-11-17 DIAGNOSIS — L570 Actinic keratosis: Secondary | ICD-10-CM | POA: Diagnosis not present

## 2019-11-23 DIAGNOSIS — Z96649 Presence of unspecified artificial hip joint: Secondary | ICD-10-CM | POA: Diagnosis not present

## 2019-11-23 DIAGNOSIS — K219 Gastro-esophageal reflux disease without esophagitis: Secondary | ICD-10-CM | POA: Diagnosis not present

## 2019-11-23 DIAGNOSIS — Z791 Long term (current) use of non-steroidal anti-inflammatories (NSAID): Secondary | ICD-10-CM | POA: Diagnosis not present

## 2019-11-23 DIAGNOSIS — J309 Allergic rhinitis, unspecified: Secondary | ICD-10-CM | POA: Diagnosis not present

## 2019-11-23 DIAGNOSIS — E785 Hyperlipidemia, unspecified: Secondary | ICD-10-CM | POA: Diagnosis not present

## 2019-11-23 DIAGNOSIS — I10 Essential (primary) hypertension: Secondary | ICD-10-CM | POA: Diagnosis not present

## 2019-11-23 DIAGNOSIS — Z7982 Long term (current) use of aspirin: Secondary | ICD-10-CM | POA: Diagnosis not present

## 2019-11-23 DIAGNOSIS — G8929 Other chronic pain: Secondary | ICD-10-CM | POA: Diagnosis not present

## 2019-11-23 DIAGNOSIS — M810 Age-related osteoporosis without current pathological fracture: Secondary | ICD-10-CM | POA: Diagnosis not present

## 2019-11-23 DIAGNOSIS — M1990 Unspecified osteoarthritis, unspecified site: Secondary | ICD-10-CM | POA: Diagnosis not present

## 2019-12-22 ENCOUNTER — Other Ambulatory Visit: Payer: Self-pay | Admitting: Family Medicine

## 2019-12-22 DIAGNOSIS — Z1231 Encounter for screening mammogram for malignant neoplasm of breast: Secondary | ICD-10-CM

## 2020-01-01 DIAGNOSIS — M25551 Pain in right hip: Secondary | ICD-10-CM | POA: Diagnosis not present

## 2020-01-01 DIAGNOSIS — M5416 Radiculopathy, lumbar region: Secondary | ICD-10-CM | POA: Diagnosis not present

## 2020-01-11 DIAGNOSIS — M545 Low back pain: Secondary | ICD-10-CM | POA: Diagnosis not present

## 2020-01-26 DIAGNOSIS — N183 Chronic kidney disease, stage 3 unspecified: Secondary | ICD-10-CM | POA: Diagnosis not present

## 2020-01-26 DIAGNOSIS — M8580 Other specified disorders of bone density and structure, unspecified site: Secondary | ICD-10-CM | POA: Diagnosis not present

## 2020-01-26 DIAGNOSIS — J301 Allergic rhinitis due to pollen: Secondary | ICD-10-CM | POA: Diagnosis not present

## 2020-01-26 DIAGNOSIS — K219 Gastro-esophageal reflux disease without esophagitis: Secondary | ICD-10-CM | POA: Diagnosis not present

## 2020-01-26 DIAGNOSIS — E782 Mixed hyperlipidemia: Secondary | ICD-10-CM | POA: Diagnosis not present

## 2020-01-26 DIAGNOSIS — I129 Hypertensive chronic kidney disease with stage 1 through stage 4 chronic kidney disease, or unspecified chronic kidney disease: Secondary | ICD-10-CM | POA: Diagnosis not present

## 2020-01-26 DIAGNOSIS — Z0000 Encounter for general adult medical examination without abnormal findings: Secondary | ICD-10-CM | POA: Diagnosis not present

## 2020-01-26 DIAGNOSIS — Z23 Encounter for immunization: Secondary | ICD-10-CM | POA: Diagnosis not present

## 2020-01-26 DIAGNOSIS — R7303 Prediabetes: Secondary | ICD-10-CM | POA: Diagnosis not present

## 2020-02-02 ENCOUNTER — Ambulatory Visit: Payer: Medicare PPO

## 2020-02-08 ENCOUNTER — Other Ambulatory Visit: Payer: Self-pay

## 2020-02-08 ENCOUNTER — Ambulatory Visit
Admission: RE | Admit: 2020-02-08 | Discharge: 2020-02-08 | Disposition: A | Discharge status: Home / Self Care | Payer: Medicare PPO | Source: Ambulatory Visit | Attending: Family Medicine | Admitting: Family Medicine

## 2020-02-08 DIAGNOSIS — Z1231 Encounter for screening mammogram for malignant neoplasm of breast: Secondary | ICD-10-CM

## 2020-03-07 DIAGNOSIS — R300 Dysuria: Secondary | ICD-10-CM | POA: Diagnosis not present

## 2020-03-07 DIAGNOSIS — R8290 Unspecified abnormal findings in urine: Secondary | ICD-10-CM | POA: Diagnosis not present

## 2020-03-24 ENCOUNTER — Ambulatory Visit (INDEPENDENT_AMBULATORY_CARE_PROVIDER_SITE_OTHER): Payer: Medicare PPO

## 2020-03-24 ENCOUNTER — Other Ambulatory Visit: Payer: Self-pay

## 2020-03-24 DIAGNOSIS — M810 Age-related osteoporosis without current pathological fracture: Secondary | ICD-10-CM

## 2020-03-24 MED ORDER — DENOSUMAB 60 MG/ML ~~LOC~~ SOSY
60.0000 mg | PREFILLED_SYRINGE | Freq: Once | SUBCUTANEOUS | Status: AC
  Administered 2020-03-24: 60 mg via SUBCUTANEOUS

## 2020-03-24 NOTE — Progress Notes (Signed)
Patient received prolia injection.  Tolerated well.

## 2020-06-13 DIAGNOSIS — D485 Neoplasm of uncertain behavior of skin: Secondary | ICD-10-CM | POA: Diagnosis not present

## 2020-06-13 DIAGNOSIS — H61001 Unspecified perichondritis of right external ear: Secondary | ICD-10-CM | POA: Diagnosis not present

## 2020-06-13 DIAGNOSIS — L570 Actinic keratosis: Secondary | ICD-10-CM | POA: Diagnosis not present

## 2020-06-27 ENCOUNTER — Ambulatory Visit: Payer: Medicare PPO | Admitting: Internal Medicine

## 2020-06-27 ENCOUNTER — Encounter: Payer: Self-pay | Admitting: Internal Medicine

## 2020-06-27 ENCOUNTER — Other Ambulatory Visit: Payer: Self-pay

## 2020-06-27 VITALS — BP 136/70 | HR 94 | Ht 63.0 in | Wt 142.2 lb

## 2020-06-27 DIAGNOSIS — E559 Vitamin D deficiency, unspecified: Secondary | ICD-10-CM

## 2020-06-27 DIAGNOSIS — M810 Age-related osteoporosis without current pathological fracture: Secondary | ICD-10-CM | POA: Diagnosis not present

## 2020-06-27 NOTE — Progress Notes (Addendum)
Patient ID: Desiree Caldwell, female   DOB: 1934/04/11, 87 y.o.   MRN: [id number]   This visit occurred during the SARS-CoV-2 public health emergency.  Safety protocols were in place, including screening questions prior to the visit, additional usage of staff PPE, and extensive cleaning of exam room while observing appropriate contact time as indicated for disinfecting solutions.   HPI  Desiree Caldwell is a 87 y.o.-year-old delightful female, initially referred by her PCP, Dr. Brigitte Pulse, returning for follow-up: Clinical osteoporosis.  Last visit a year ago.  She was diagnosed with osteopenia on DXA scan in 2014.  She has clinical osteoporosis due to the high FRAX score.  Review latest DXA scan reports: Date L1-L4 T score FN T score 33% distal Radius FRAX score  04/07/2019 ([facility])  0.0 RFN: n/a (history of hip replacement) LFN: -2.4 n/a 10-year MOF: 26.5% 10-year hip fracture risk: 8.9%   Her only fracture was a left 5th metatarsal fracture in 2011 - misplaced foot when coming down stairs.  No recent falls.  She has a history of right total hip replacement in 2016.  She denies dizziness/vertigo/orthostasis/poor vision.  Her balance is not great after her THR, however, she did not have any falls since last visit.  Previous OP treatments:  - Evista 2014-2016 - Prolia -tolerating well: 1. 09/17/2019 2. 03/24/2020  Reviewed her vitamin D levels: Lab Results  Component Value Date   VD25OH 61.97 08/27/2019   VD25OH 25.34 (L) 06/24/2019   She is not on calcium supplements.  She takes vitamin D 2000 units daily, started 06/2019.  She likes to go to the beach often.  Diet: - Breakfast: Toast; oatmeal; cereal - Lunch: Half a sandwich or occasionally eating out - Dinner: Meat with vegetables or vegetarian meal - Snacks: Usually 1 a day: Cheese, fruit, nuts  At last visit, she was not doing weightbearing exercise, but she was going to the [place] where she was walking on the  treadmill and using the stationary bike.  She also started weightbearing exercises after last visit.  She tries to go to the gym 3-5 times a week.  She does not take high vitamin A doses.  Menopause was at 87 y/o.  She has a history of TAH (7438, at 87 years old) and BSO (in 2817, at 87 years old).   She denies FH of osteoporosis.  No history of kidney stones, hyper or hypocalcemia or hyperparathyroidism: Lab Results  Component Value Date   CALCIUM 9.7 06/24/2019   CALCIUM 8.8 06/23/2014   CALCIUM 8.8 06/22/2014   CALCIUM 9.7 06/14/2014  01/14/2019: Calcium 9.4 (8.6-10.3)  No history of thyrotoxicosis: Lab Results  Component Value Date   TSH 3.92 06/24/2019    She has stage II-III hypertensive CKD. Last BUN/Cr: Lab Results  Component Value Date   BUN 18 06/24/2019   CREATININE 1.01 06/24/2019  01/14/2019: 12/0.94, GFR 67  She also has a history of HTN, HL, type 2 diabetes, GERD.  Daughter with melanoma and ThyCA.  ROS: Constitutional: no weight gain/no weight loss, no fatigue, no subjective hyperthermia, no subjective hypothermia Eyes: no blurry vision, no xerophthalmia ENT: no sore throat, no nodules palpated in neck, no dysphagia, no odynophagia, no hoarseness Cardiovascular: no CP/no SOB/no palpitations/no leg swelling Respiratory: no cough/no SOB/no wheezing Gastrointestinal: no N/no V/no D/no C/+ acid reflux Musculoskeletal: no muscle aches/no joint aches Skin: no rashes, no hair loss Neurological: no tremors/no numbness/no tingling/no dizziness, + occasional sciatic pain  I reviewed pt's medications,  allergies, PMH, social hx, family hx, and changes were documented in the history of present illness. Otherwise, unchanged from my initial visit note.  Past Medical History:  Diagnosis Date   Arthritis    Complication of anesthesia    SLOW TO WAKE UP   Difficulty sleeping    DUE TO PAIN   Diverticulosis    GERD (gastroesophageal reflux disease)     Hyperlipidemia    Hypertension    Osteopenia    Seasonal allergies    Past Surgical History:  Procedure Laterality Date   ABDOMINAL HYSTERECTOMY     > 20 YRS AGO    BILATERAL OOPHORECTOMY  1991   BREAST BIOPSY Right 11/24/2014    benign   TOTAL HIP ARTHROPLASTY Right 06/21/2014   Procedure: RIGHT TOTAL HIP ARTHROPLASTY ANTERIOR APPROACH;  Surgeon: Gearlean Alf, MD;  Location: [facility];  Service: Orthopedics;  Laterality: Right;   Social History   Socioeconomic History   Marital status: Divorced    Spouse name: Not on file   Number of children: 1- daughter - 47 y/o in 06/2019   Years of education: Not on file   Highest education level: Not on file  Occupational History    Retired Art therapist  Tobacco Use   Smoking status: Never Smoker  Substance and Sexual Activity   Alcohol use: Yes    Comment: RARE   Drug use: No   Sexual activity: Not on file  Other Topics Concern   Not on file  Social History Narrative   Not on file   Social Determinants of Health   Financial Resource Strain:    Difficulty of Paying Living Expenses: Not on file  Food Insecurity:    Worried About Charity fundraiser in the Last Year: Not on file   [employer] of Food in the Last Year: Not on file  Transportation Needs:    Lack of Transportation (Medical): Not on file   Lack of Transportation (Non-Medical): Not on file  Physical Activity:    Days of Exercise per Week: Not on file   Minutes of Exercise per Session: Not on file  Stress:    Feeling of Stress : Not on file  Social Connections:    Frequency of Communication with Friends and Family: Not on file   Frequency of Social Gatherings with Friends and Family: Not on file   Attends Religious Services: Not on file   Active Member of Clubs or Organizations: Not on file   Attends Archivist Meetings: Not on file   Marital Status: Not on file  Intimate Partner Violence:    Fear of Current or  Ex-Partner: Not on file   Emotionally Abused: Not on file   Physically Abused: Not on file   Sexually Abused: Not on file   Current Outpatient Medications on File Prior to Visit  Medication Sig Dispense Refill   aspirin EC 81 MG tablet Take 81 mg by mouth daily.     fluticasone (FLONASE) 50 MCG/ACT nasal spray Place 2 sprays into both nostrils every morning.     hydrochlorothiazide (HYDRODIURIL) 25 MG tablet Take 12.5 mg by mouth every morning.     omeprazole (PRILOSEC) 20 MG capsule Take 20 mg by mouth every morning.     simvastatin (ZOCOR) 80 MG tablet Take 80 mg by mouth at bedtime.     No current facility-administered medications on file prior to visit.   No Known Allergies Family History  Problem Relation Age of  Onset   Breast cancer Neg Hx     PE: BP 136/70 (BP Location: Right Arm, Patient Position: Sitting, Cuff Size: Normal)    Pulse 94    Ht 5\' 3"  (1.6 m)    Wt 142 lb 3.2 oz (64.5 kg)    SpO2 97%    BMI 25.19 kg/m  Wt Readings from Last 3 Encounters:  06/27/20 142 lb 3.2 oz (64.5 kg)  06/24/19 138 lb (62.6 kg)  06/29/14 162 lb (73.5 kg)   Constitutional: normal weight, in NAD Eyes: PERRLA, EOMI, no exophthalmos ENT: moist mucous membranes, no thyromegaly, no cervical lymphadenopathy Cardiovascular: RRR, No MRG Respiratory: CTA B Gastrointestinal: abdomen soft, NT, ND, BS+ Musculoskeletal: no deformities, strength intact in all 4 Skin: moist, warm, no rashes Neurological: no tremor with outstretched hands, DTR normal in all 4  Assessment: 1.  Clinical osteoporosis  2.  Vitamin D insufficiency  Plan: 1.  Clinical osteoporosis -Likely postmenopausal/age-related, but also possibly due to early TAH (although she had BSO only after she became menopausal).  No family history of osteoporosis. -No falls or fractures since last visit -Based on the latest bone density scan report, she appears to be at higher risk for fractures. -At last visit, she revealed a  history of GERD and she was hesitant to take p.o. medications for osteoporosis.  I suggested Prolia and we discussed about benefits and possible side effects including atypical fractures and ONJ.  Since then, we were able to start Prolia and she had the first injection on 03/24/2020.  She has no side effects from this.  No jaw/thigh/hip pain. -At this visit, she again reports 1000 to 1200 mg of calcium daily from the diet. -At last visit, we discussed about weightbearing exercises and I given her the National osteoporosis foundation recommendations.  Also, I recommended skeletal loading and gave her OsteoStrong brochure - did not try this.  We also discussed about a low acid diet - she started to do this, accompanied by no smoking and no more than 1 alcoholic drinks a day.  We also discussed at that time and again today about fall precautions - she is being very careful. -We will check the following labs at next lab draw (she will return for these): Vitamin D and BMP -For now, we will continue with Prolia - We discussed about repeating bone density scan - she is due in 03/2021.  Explained that stable or increased T-scores are desirable. - will see pt back in 1 year  2.  Vitamin D insufficiency -Her vitamin D level was low at last visit, and 25. -At that time, I advised her to stop 2000 vitamin D daily -We will recheck her level at next lab draw  Orders Placed This Encounter  Procedures   Basic metabolic panel   VITAMIN D 25 Hydroxy (Vit-D Deficiency, Fractures)   Component     Latest Ref Rng & Units 06/29/2020  Sodium     135 - 145 mEq/L 138  Potassium     3.5 - 5.1 mEq/L 3.5  Chloride     96 - 112 mEq/L 99  CO2     19 - 32 mEq/L 30  Glucose     70 - 99 mg/dL 93  BUN     6 - 23 mg/dL 17  Creatinine     0.40 - 1.20 mg/dL 1.03  GFR     >60.00 mL/min 49.93 (L)  Calcium     8.4 - 10.5 mg/dL 9.8  Vitamin D, 25-Hydroxy     30.0 - 100.0 ng/mL 52.8   Philemon Kingdom, MD  PhD [facility] Endocrinology

## 2020-06-27 NOTE — Patient Instructions (Addendum)
Please return for labs - nonfasting.  Continue Prolia.  Please come back for a follow-up appointment in 1 year.

## 2020-06-29 ENCOUNTER — Other Ambulatory Visit (INDEPENDENT_AMBULATORY_CARE_PROVIDER_SITE_OTHER): Payer: Medicare PPO

## 2020-06-29 ENCOUNTER — Other Ambulatory Visit: Payer: Self-pay

## 2020-06-29 DIAGNOSIS — M810 Age-related osteoporosis without current pathological fracture: Secondary | ICD-10-CM

## 2020-06-29 DIAGNOSIS — E559 Vitamin D deficiency, unspecified: Secondary | ICD-10-CM

## 2020-06-29 LAB — BASIC METABOLIC PANEL
BUN: 17 mg/dL (ref 6–23)
CO2: 30 mEq/L (ref 19–32)
Calcium: 9.8 mg/dL (ref 8.4–10.5)
Chloride: 99 mEq/L (ref 96–112)
Creatinine, Ser: 1.03 mg/dL (ref 0.40–1.20)
GFR: 49.93 mL/min — ABNORMAL LOW (ref >60.00)
Glucose, Bld: 93 mg/dL (ref 70–99)
Potassium: 3.5 mEq/L (ref 3.5–5.1)
Sodium: 138 mEq/L (ref 135–145)

## 2020-06-30 LAB — VITAMIN D 25 HYDROXY (VIT D DEFICIENCY, FRACTURES): Vit D, 25-Hydroxy: 52.8 ng/mL (ref 30.0–100.0)

## 2020-07-18 DIAGNOSIS — L570 Actinic keratosis: Secondary | ICD-10-CM | POA: Diagnosis not present

## 2020-07-26 DIAGNOSIS — I129 Hypertensive chronic kidney disease with stage 1 through stage 4 chronic kidney disease, or unspecified chronic kidney disease: Secondary | ICD-10-CM | POA: Diagnosis not present

## 2020-07-26 DIAGNOSIS — N183 Chronic kidney disease, stage 3 unspecified: Secondary | ICD-10-CM | POA: Diagnosis not present

## 2020-07-26 DIAGNOSIS — R7303 Prediabetes: Secondary | ICD-10-CM | POA: Diagnosis not present

## 2020-07-26 DIAGNOSIS — E782 Mixed hyperlipidemia: Secondary | ICD-10-CM | POA: Diagnosis not present

## 2020-09-05 ENCOUNTER — Telehealth: Payer: Self-pay | Admitting: Internal Medicine

## 2020-09-05 NOTE — Telephone Encounter (Signed)
Pt calling Regarding Receiving her prolia shot. To see if her insurance approved for her to have it.   Pt would like a call back.

## 2020-09-06 NOTE — Telephone Encounter (Signed)
Prolia VOB initiated via [email or web address] 

## 2020-09-07 NOTE — Telephone Encounter (Signed)
MEDICAL BENEFIT SUMMARY Patient Out-of-Pocket Responsibility Coverage Available Authorization Required Deductible Co-pay/Coinsurance Prolia OOP COST PHYSICIAN FACILITY FEE ADMIN FEE PURCHASE OR REFERRAL: YES YES PA on File PRIMARY No SECONDARY No No* No* $40* SPECIALTY PHARMACY (via Medical Benefit): NO YES No* No* No* *Reflects patient costs once plan deductible is met. Please see Medical Benefit Details for further information regarding patient costs. Patient costs may vary based on services rendered. BENEFITS VERIFIED FOR THE FOLLOWING DIAGNOSIS AND INSURANCE PLANS Verified for Diagnosis M81.0 Site of Care  MD Office   Policy Level: Primary Policy Status: Active Payer Name: [facility] Plan Name: Unknown Policy Number: [id number] Employer Name: [employer] Plan Type: Medicare Managed Care Group Number: [id number]  Payer Phone: [phone] PRIMARY MEDICAL BENEFIT DETAILS (PHYSICIAN PURCHASE, OR REFERRAL TO TREATING SITE) COVERAGE AVAILABLE: Yes COVERAGE DETAILS: The benefits provided on this Verification of Benefits form are Medical Benefits and are the patient's In-Network benefits for Prolia. If you would like Pharmacy Benefits for Prolia, please call [phone].   AUTHORIZATION REQUIRED: Yes   PA PROCESS DETAILS: PA is required. PA can be initiated by calling [phone] or online at [email or web address]

## 2020-09-10 NOTE — Telephone Encounter (Signed)
Prior Auth initiated via [email or web address]  Riley Lam (Key: [id number])  Your information has been sent to [facility].   The following medications may be covered If clinically appropriate, you may change the prescription. A therapeutic alternative may be available. Questions on alternatives? We're here to help. [place] at [phone]. You can also review [employer] NCPDP 2017's formulary online or call them directly. Compare the original medication with possible alternatives:  PA REQUIREMENT Prolia 60MG /ML syringes Not Required Alendronate 70 Mg Tab Not Required Evenity 105 Mg/1.17 Ml Syrg Required Evenity 210Mg /2.34Ml ( 105Mg /1.17Mlx2) Syrg Required Ibandronate (Soln) Required Ibandronate (Syrg) Required Ibandronate (Tab) Required Risedronate 150 Mg Tab Required Teriparatide Required Tymlos Required Xgeva Required  Zoledronic Acid-Mannitol-Water 5 Mg/100 Ml Pgbk Required Terms of service apply. Alternatives and PA Requirements listed above are based on third party available formulary data and may not apply to all plans. Check patient's specific plan formulary.

## 2020-09-10 NOTE — Telephone Encounter (Signed)
HPI  Desiree Caldwell is a 87 y.o.-year-old delightful female, initially referred by her PCP, Dr. Brigitte Pulse, returning for follow-up: Clinical osteoporosis.  Last visit a year ago.  She was diagnosed with osteopenia on DXA scan in 2014.  She has clinical osteoporosis due to the high FRAX score.  Review latest DXA scan reports: Date L1-L4 T score FN T score 33% distal Radius FRAX score  04/07/2019 ([facility])  0.0 RFN: n/a (history of hip replacement) LFN: -2.4 n/a 10-year MOF: 26.5% 10-year hip fracture risk: 8.9%   Her only fracture was a left 5th metatarsal fracture in 2011 - misplaced foot when coming down stairs.  No recent falls.  She has a history of right total hip replacement in 2016.  She denies dizziness/vertigo/orthostasis/poor vision.  Her balance is not great after her THR, however, she did not have any falls since last visit.  Previous OP treatments:  - Evista 2014-2016 - Prolia -tolerating well: 1. 09/17/2019 2. 03/24/2020  Reviewed her vitamin D levels: Recent Labs       Lab Results  Component Value Date   VD25OH 61.97 08/27/2019   VD25OH 25.34 (L) 06/24/2019     She is not on calcium supplements.  She takes vitamin D 2000 units daily, started 06/2019.  She likes to go to the beach often.  Diet: - Breakfast: Toast; oatmeal; cereal - Lunch: Half a sandwich or occasionally eating out - Dinner: Meat with vegetables or vegetarian meal - Snacks: Usually 1 a day: Cheese, fruit, nuts  At last visit, she was not doing weightbearing exercise, but she was going to the [facility] where she was walking on the treadmill and using the stationary bike.  She also started weightbearing exercises after last visit.  She tries to go to the gym 3-5 times a week.  She does not take high vitamin A doses.  Menopause was at 87 y/o.  She has a history of TAH (1550, at 87 years old) and BSO (in 3630, at 87 years old).   She denies FH of osteoporosis.  No  history of kidney stones, hyper or hypocalcemia or hyperparathyroidism: Recent Labs       Lab Results  Component Value Date   CALCIUM 9.7 06/24/2019   CALCIUM 8.8 06/23/2014   CALCIUM 8.8 06/22/2014   CALCIUM 9.7 06/14/2014    01/14/2019: Calcium 9.4 (8.6-10.3)  No history of thyrotoxicosis: Recent Labs       Lab Results  Component Value Date   TSH 3.92 06/24/2019      She has stage II-III hypertensive CKD. Last BUN/Cr: Recent Labs       Lab Results  Component Value Date   BUN 18 06/24/2019   CREATININE 1.01 06/24/2019    01/14/2019: 12/0.94, GFR 67  She also has a history of HTN, HL, type 2 diabetes, GERD.  Daughter with melanoma and ThyCA.   Plan: 1.  Clinical osteoporosis -Likely postmenopausal/age-related, but also possibly due to early TAH (although she had BSO only after she became menopausal).  No family history of osteoporosis. -No falls or fractures since last visit -Based on the latest bone density scan report, she appears to be at higher risk for fractures. -At last visit, she revealed a history of GERD and she was hesitant to take p.o. medications for osteoporosis.  I suggested Prolia and we discussed about benefits and possible side effects including atypical fractures and ONJ.  Since then, we were able to start Prolia and she had the first injection  on 03/24/2020.  She has no side effects from this.  No jaw/thigh/hip pain. -At this visit, she again reports 1000 to 1200 mg of calcium daily from the diet. -At last visit, we discussed about weightbearing exercises and I given her the National osteoporosis foundation recommendations.  Also, I recommended skeletal loading and gave her OsteoStrong brochure - did not try this.  We also discussed about a low acid diet - she started to do this, accompanied by no smoking and no more than 1 alcoholic drinks a day.  We also discussed at that time and again today about fall precautions - she is being very  careful. -We will check the following labs at next lab draw (she will return for these): Vitamin D and BMP -For now, we will continue with Prolia - We discussed about repeating bone density scan - she is due in 03/2021.  Explained that stable or increased T-scores are desirable.

## 2020-09-13 NOTE — Telephone Encounter (Addendum)
Pt ready for scheduling on or after 09/22/20  Out-of-pocket cost due at time of visit: $40  Prolia co-insurance: 0% Admin fee co-insurance: $40.00  Deductible does not apply  Prior Auth APPROVED valid 09/07/19-05/13/21

## 2020-09-13 NOTE — Telephone Encounter (Signed)
[facility] Medicare [place] PA# [id number] Valid 09/07/19-05/13/21   [facility] Key: [id number] - PA Case ID: [id number] Need help? Call us at [phone] Outcome Approvedon May 1 PA Case: [id number], Status: Approved, Coverage Starts on: 09/07/2019 12:00:00 AM, Coverage Ends on: 05/13/2021 12:00:00 AM. Questions? Contact [phone]. Drug Prolia 60MG /ML syringes Form Nurse, adult and Medical Benefit PA Form

## 2020-09-15 NOTE — Telephone Encounter (Signed)
Patient scheduled for 09/27/2020 for prolia. Patient aware of $40.00 copay.

## 2020-09-27 ENCOUNTER — Ambulatory Visit: Payer: Medicare PPO

## 2020-09-27 ENCOUNTER — Other Ambulatory Visit: Payer: Self-pay

## 2020-09-27 DIAGNOSIS — M810 Age-related osteoporosis without current pathological fracture: Secondary | ICD-10-CM

## 2020-09-27 MED ORDER — DENOSUMAB 60 MG/ML ~~LOC~~ SOSY
60.0000 mg | PREFILLED_SYRINGE | Freq: Once | SUBCUTANEOUS | Status: AC
  Administered 2020-09-27: 60 mg via SUBCUTANEOUS

## 2020-09-27 NOTE — Progress Notes (Signed)
Prolia injection administered to pt's left arm. Pt tolerated well. °

## 2020-10-02 NOTE — Telephone Encounter (Signed)
Pt received Prolia inj 09/27/20 Next Prolia inj due 03/31/21

## 2020-11-17 DIAGNOSIS — L821 Other seborrheic keratosis: Secondary | ICD-10-CM | POA: Diagnosis not present

## 2020-11-17 DIAGNOSIS — L720 Epidermal cyst: Secondary | ICD-10-CM | POA: Diagnosis not present

## 2020-11-17 DIAGNOSIS — Z85828 Personal history of other malignant neoplasm of skin: Secondary | ICD-10-CM | POA: Diagnosis not present

## 2020-11-17 DIAGNOSIS — L570 Actinic keratosis: Secondary | ICD-10-CM | POA: Diagnosis not present

## 2020-11-17 DIAGNOSIS — L578 Other skin changes due to chronic exposure to nonionizing radiation: Secondary | ICD-10-CM | POA: Diagnosis not present

## 2020-11-17 DIAGNOSIS — D225 Melanocytic nevi of trunk: Secondary | ICD-10-CM | POA: Diagnosis not present

## 2020-11-17 DIAGNOSIS — L814 Other melanin hyperpigmentation: Secondary | ICD-10-CM | POA: Diagnosis not present

## 2021-01-04 ENCOUNTER — Other Ambulatory Visit: Payer: Self-pay | Admitting: Family Medicine

## 2021-01-04 DIAGNOSIS — Z1231 Encounter for screening mammogram for malignant neoplasm of breast: Secondary | ICD-10-CM

## 2021-01-25 DIAGNOSIS — U071 COVID-19: Secondary | ICD-10-CM | POA: Diagnosis not present

## 2021-01-30 DIAGNOSIS — U071 COVID-19: Secondary | ICD-10-CM | POA: Diagnosis not present

## 2021-02-08 DIAGNOSIS — K219 Gastro-esophageal reflux disease without esophagitis: Secondary | ICD-10-CM | POA: Diagnosis not present

## 2021-02-08 DIAGNOSIS — M8580 Other specified disorders of bone density and structure, unspecified site: Secondary | ICD-10-CM | POA: Diagnosis not present

## 2021-02-08 DIAGNOSIS — N183 Chronic kidney disease, stage 3 unspecified: Secondary | ICD-10-CM | POA: Diagnosis not present

## 2021-02-08 DIAGNOSIS — J301 Allergic rhinitis due to pollen: Secondary | ICD-10-CM | POA: Diagnosis not present

## 2021-02-08 DIAGNOSIS — Z0000 Encounter for general adult medical examination without abnormal findings: Secondary | ICD-10-CM | POA: Diagnosis not present

## 2021-02-08 DIAGNOSIS — I129 Hypertensive chronic kidney disease with stage 1 through stage 4 chronic kidney disease, or unspecified chronic kidney disease: Secondary | ICD-10-CM | POA: Diagnosis not present

## 2021-02-08 DIAGNOSIS — E782 Mixed hyperlipidemia: Secondary | ICD-10-CM | POA: Diagnosis not present

## 2021-02-08 DIAGNOSIS — R7303 Prediabetes: Secondary | ICD-10-CM | POA: Diagnosis not present

## 2021-02-08 DIAGNOSIS — M1990 Unspecified osteoarthritis, unspecified site: Secondary | ICD-10-CM | POA: Diagnosis not present

## 2021-02-15 ENCOUNTER — Ambulatory Visit: Payer: Medicare PPO

## 2021-02-18 NOTE — Telephone Encounter (Signed)
Prolia VOB initiated via [email or web address]  Last OV:  Next OV:  Last Prolia inj: 09/27/20 Next Prolia inj DUE: 03/31/21

## 2021-02-22 NOTE — Telephone Encounter (Signed)
Pt ready for scheduling on or after 03/31/21  Out-of-pocket cost due at time of visit: $280  Primary: [employer] Prolia co-insurance: 20% (approximately $255) Admin fee co-insurance: 20% (approximately $25)  Secondary: n/a Prolia co-insurance:  Admin fee co-insurance:   Deductible: does not apply  PA APPROVED PA# [id number] Valid 09/07/19-05/13/21  (Alternative prior auth info provided by [employer]: Prior authorization is on file (Authorization # [id number]) and is valid from 08/06/2020 through 05/13/2021.)  ** This summary of benefits is an estimation of the patient's out-of-pocket cost. Exact cost may vary based on individual plan coverage.

## 2021-02-23 NOTE — Telephone Encounter (Signed)
Left vm to schedule.

## 2021-02-24 NOTE — Telephone Encounter (Signed)
Patient called to advise that she has a letter from [facility] that show she does not have a co-pay for Prolia injection. Patient also advises that she has not had to pay for injections prior.  Patient requesting a call to [phone] to discuss cost and why she now has to pay $280

## 2021-03-01 NOTE — Telephone Encounter (Signed)
Please follow up

## 2021-03-07 NOTE — Telephone Encounter (Signed)
I've reached out to [facility] rep, regarding discrepancy. Will updated encounter as soon as additional information becomes available.

## 2021-03-13 DIAGNOSIS — H02831 Dermatochalasis of right upper eyelid: Secondary | ICD-10-CM | POA: Diagnosis not present

## 2021-03-13 DIAGNOSIS — Z961 Presence of intraocular lens: Secondary | ICD-10-CM | POA: Diagnosis not present

## 2021-03-13 DIAGNOSIS — H18593 Other hereditary corneal dystrophies, bilateral: Secondary | ICD-10-CM | POA: Diagnosis not present

## 2021-03-13 DIAGNOSIS — H04123 Dry eye syndrome of bilateral lacrimal glands: Secondary | ICD-10-CM | POA: Diagnosis not present

## 2021-03-15 ENCOUNTER — Other Ambulatory Visit: Payer: Self-pay

## 2021-03-15 ENCOUNTER — Ambulatory Visit
Admission: RE | Admit: 2021-03-15 | Discharge: 2021-03-15 | Disposition: A | Discharge status: Home / Self Care | Payer: Medicare PPO | Source: Ambulatory Visit | Attending: Family Medicine | Admitting: Family Medicine

## 2021-03-15 DIAGNOSIS — Z1231 Encounter for screening mammogram for malignant neoplasm of breast: Secondary | ICD-10-CM

## 2021-03-16 NOTE — Telephone Encounter (Signed)
Benefit reverification in progress.

## 2021-03-21 NOTE — Telephone Encounter (Signed)
PA initiated via [email or web address] KEY: [id number]

## 2021-03-23 NOTE — Telephone Encounter (Signed)
Pt ready for scheduling on or after 03/31/21  Out-of-pocket cost due at time of visit: $0.00  Primary: [employer] Prolia co-insurance: 0% Admin fee co-insurance: 0%  Secondary: n/a Prolia co-insurance:  Admin fee co-insurance:   Deductible: does not apply  Prior Auth: APPROVED  PA# [id number] Valid: 09/07/19-05/13/22    ** This summary of benefits is an estimation of the patient's out-of-pocket cost. Exact cost may very based on individual plan coverage.

## 2021-03-31 ENCOUNTER — Other Ambulatory Visit: Payer: Self-pay

## 2021-03-31 ENCOUNTER — Ambulatory Visit (INDEPENDENT_AMBULATORY_CARE_PROVIDER_SITE_OTHER): Payer: Medicare PPO

## 2021-03-31 DIAGNOSIS — M810 Age-related osteoporosis without current pathological fracture: Secondary | ICD-10-CM

## 2021-03-31 MED ORDER — DENOSUMAB 60 MG/ML ~~LOC~~ SOSY
60.0000 mg | PREFILLED_SYRINGE | Freq: Once | SUBCUTANEOUS | Status: AC
  Administered 2021-03-31: 60 mg via SUBCUTANEOUS

## 2021-03-31 NOTE — Progress Notes (Signed)
Prolia injection administered Left Arm SubQ. Patient tolerated well.

## 2021-04-15 NOTE — Telephone Encounter (Signed)
Last Prolia inj 03/31/21 Next Prolia inj due 09/29/21

## 2021-05-22 DIAGNOSIS — D0439 Carcinoma in situ of skin of other parts of face: Secondary | ICD-10-CM | POA: Diagnosis not present

## 2021-05-22 DIAGNOSIS — D485 Neoplasm of uncertain behavior of skin: Secondary | ICD-10-CM | POA: Diagnosis not present

## 2021-05-22 DIAGNOSIS — Z23 Encounter for immunization: Secondary | ICD-10-CM | POA: Diagnosis not present

## 2021-06-27 ENCOUNTER — Encounter: Payer: Self-pay | Admitting: Internal Medicine

## 2021-06-27 ENCOUNTER — Other Ambulatory Visit: Payer: Self-pay

## 2021-06-27 ENCOUNTER — Ambulatory Visit: Payer: Medicare PPO | Admitting: Internal Medicine

## 2021-06-27 VITALS — BP 128/80 | HR 80 | Ht 63.0 in | Wt 136.8 lb

## 2021-06-27 DIAGNOSIS — M810 Age-related osteoporosis without current pathological fracture: Secondary | ICD-10-CM

## 2021-06-27 DIAGNOSIS — E559 Vitamin D deficiency, unspecified: Secondary | ICD-10-CM | POA: Diagnosis not present

## 2021-06-27 LAB — VITAMIN D 25 HYDROXY (VIT D DEFICIENCY, FRACTURES): VITD: 67.64 ng/mL (ref 30.00–100.00)

## 2021-06-27 NOTE — Patient Instructions (Addendum)
Please stop at the lab.  Continue Prolia.  Please schedule a new DXA scan.  Please come back for a follow-up appointment in 1 year.

## 2021-06-27 NOTE — Progress Notes (Signed)
Patient ID: Desiree Caldwell, female   DOB: 1933-12-03, 88 y.o.   MRN: [id number]   This visit occurred during the SARS-CoV-2 public health emergency.  Safety protocols were in place, including screening questions prior to the visit, additional usage of staff PPE, and extensive cleaning of exam room while observing appropriate contact time as indicated for disinfecting solutions.   HPI  Desiree Caldwell is a 88 y.o.-year-old delightful female, initially referred by her PCP, Dr. Brigitte Pulse, returning for follow-up for clinical osteoporosis.  Last visit a year ago.  Interim history: No falls or fractures since last visit. No vertigo/orthostasis/poor vision.   Occasionally has dizziness (especially when bending over) - takes prn dramamine. She had Covid in 01/2021 >> very sick - in bed x 2 weeks!  Reviewed and addended history: She was diagnosed with osteopenia on DXA scan in 2014.  She has clinical osteoporosis due to the high FRAX score.  Review latest DXA scan reports: Date L1-L4 T score FN T score 33% distal Radius FRAX score  04/07/2019 ([facility])  0.0 RFN: n/a (history of hip replacement) LFN: -2.4 n/a 10-year MOF: 26.5% 10-year hip fracture risk: 8.9%   Her only fracture was a left 5th metatarsal fracture in 2011 - misplaced foot when coming down stairs.    She has a history of right total hip replacement in 2016.  Previous OP treatments:  - Evista 2014-2016 - Prolia -tolerating well: 09/17/2019 03/24/2020 09/27/2020 04/01/2021  Reviewed her vitamin D levels: Lab Results  Component Value Date   VD25OH 52.8 06/29/2020   VD25OH 61.97 08/27/2019   VD25OH 25.34 (L) 06/24/2019   She is not on calcium supplements. She takes vitamin D 2000 units daily, started 06/2019. She likes to go to the beach often.  Diet: - Breakfast: Toast; oatmeal; cereal - Lunch: Half a sandwich or occasionally eating out - Dinner: Meat with vegetables or vegetarian meal - Snacks: Usually 1 a  day: Cheese, fruit, nuts  She is doing weightbearing exercise - at the [facility], also walking on the treadmill and using the stationary bike. She tries to go to the gym 5 times a week.  She does not take high vitamin A doses.  Menopause was at 88 y/o.  She has a history of TAH (8445, at 88 years old) and BSO (in 374, at 88 years old).   She denies FH of osteoporosis.  No history of kidney stones, hyper or hypocalcemia or hyperparathyroidism: 02/08/2021: Ca 9.76 Lab Results  Component Value Date   CALCIUM 9.8 06/29/2020   CALCIUM 9.7 06/24/2019   CALCIUM 8.8 06/23/2014   CALCIUM 8.8 06/22/2014   CALCIUM 9.7 06/14/2014  01/14/2019: Calcium 9.4 (8.6-10.3)  No history of thyrotoxicosis: Lab Results  Component Value Date   TSH 3.92 06/24/2019    She has stage II-III hypertensive CKD. Last BUN/Cr: Panel   2021-02-08    Glucose 89   70-99  BUN 14   6-26  Creatinine 0.96   0.60-1.30  eGFR2021 58   >60  Sodium 142   136-145  Potassium 4.3   3.5-5.5  Chloride 101   98-107  CO2 30   22-32  Anion Gap 15.0   6.0-20.0  Calcium 9.6   8.6-10.3  CA-corrected 9.76   8.60-10.30   Lab Results  Component Value Date   BUN 17 06/29/2020   CREATININE 1.03 06/29/2020  01/14/2019: 12/0.94, GFR 67  She also has a history of HTN, HL, type 2 diabetes, GERD.  Daughter has melanoma  and ThyCA.  ROS: + see HPI + acid reflux  I reviewed pt's medications, allergies, PMH, social hx, family hx, and changes were documented in the history of present illness. Otherwise, unchanged from my initial visit note.  Past Medical History:  Diagnosis Date   Arthritis    Complication of anesthesia    SLOW TO WAKE UP   Difficulty sleeping    DUE TO PAIN   Diverticulosis    GERD (gastroesophageal reflux disease)    Hyperlipidemia    Hypertension    Osteopenia    Seasonal allergies    Past Surgical History:  Procedure Laterality Date   ABDOMINAL HYSTERECTOMY     > 20 YRS AGO    BILATERAL OOPHORECTOMY   1991   BREAST BIOPSY Right 11/24/2014    benign   TOTAL HIP ARTHROPLASTY Right 06/21/2014   Procedure: RIGHT TOTAL HIP ARTHROPLASTY ANTERIOR APPROACH;  Surgeon: Gearlean Alf, MD;  Location: [facility];  Service: Orthopedics;  Laterality: Right;   Social History   Socioeconomic History   Marital status: Divorced    Spouse name: Not on file   Number of children: 1- daughter - 56 y/o in 06/2019   Years of education: Not on file   Highest education level: Not on file  Occupational History    Retired Art therapist  Tobacco Use   Smoking status: Never Smoker  Substance and Sexual Activity   Alcohol use: Yes    Comment: RARE   Drug use: No   Sexual activity: Not on file  Other Topics Concern   Not on file  Social History Narrative   Not on file   Social Determinants of Health   Financial Resource Strain:    Difficulty of Paying Living Expenses: Not on file  Food Insecurity:    Worried About Charity fundraiser in the Last Year: Not on file   [employer] of Food in the Last Year: Not on file  Transportation Needs:    Lack of Transportation (Medical): Not on file   Lack of Transportation (Non-Medical): Not on file  Physical Activity:    Days of Exercise per Week: Not on file   Minutes of Exercise per Session: Not on file  Stress:    Feeling of Stress : Not on file  Social Connections:    Frequency of Communication with Friends and Family: Not on file   Frequency of Social Gatherings with Friends and Family: Not on file   Attends Religious Services: Not on file   Active Member of Clubs or Organizations: Not on file   Attends Archivist Meetings: Not on file   Marital Status: Not on file  Intimate Partner Violence:    Fear of Current or Ex-Partner: Not on file   Emotionally Abused: Not on file   Physically Abused: Not on file   Sexually Abused: Not on file   Current Outpatient Medications on File Prior to Visit  Medication Sig Dispense Refill   aspirin EC 81 MG  tablet Take 81 mg by mouth daily.     fluticasone (FLONASE) 50 MCG/ACT nasal spray Place 2 sprays into both nostrils every morning.     hydrochlorothiazide (HYDRODIURIL) 25 MG tablet Take 12.5 mg by mouth every morning.     omeprazole (PRILOSEC) 20 MG capsule Take 20 mg by mouth every morning.     simvastatin (ZOCOR) 80 MG tablet Take 80 mg by mouth at bedtime.     No current facility-administered medications on file  prior to visit.   No Known Allergies Family History  Problem Relation Age of Onset   Breast cancer Neg Hx     PE: BP 128/80 (BP Location: Right Arm, Patient Position: Sitting, Cuff Size: Normal)    Pulse 80    Ht 5\' 3"  (1.6 m)    Wt 136 lb 12.8 oz (62.1 kg)    SpO2 97%    BMI 24.23 kg/m  Wt Readings from Last 3 Encounters:  06/27/21 136 lb 12.8 oz (62.1 kg)  06/27/20 142 lb 3.2 oz (64.5 kg)  06/24/19 138 lb (62.6 kg)   Constitutional: normal weight, in NAD, appearing younger than stated age Eyes: PERRLA, EOMI, no exophthalmos ENT: moist mucous membranes, no thyromegaly, no cervical lymphadenopathy Cardiovascular: RRR, No MRG Respiratory: CTA B Musculoskeletal: no deformities, strength intact in all 4 Skin: moist, warm, no rashes Neurological: no tremor with outstretched hands, DTR normal in all 4  Assessment: 1.  Clinical osteoporosis  2.  Vitamin D insufficiency  Plan: 1.  Clinical osteoporosis -Likely postmenopausal/age-related, but also possibly due to early TAH (although she had BSO only after she became menopausal).  No family history of osteoporosis. -No falls or fractures since last visit -Based on the latest bone density scan report, she appeared to be at higher risk for fractures. -Due to her history of GERD, she was hesitant to take p.o. medications for osteoporosis.  I suggested Prolia and we discussed about benefits and possible side effects including atypical fractures and ONJ.  We started Prolia in 09/2019.  She continues with this and does not miss  doses.  She had a total of 4 injections.  She has no side effects from it.  No jaw/thigh/hip pain. -She is getting approximately 1000-1200 mg of calcium daily from the diet.  No need for further supplementation. -I previously recommended weightbearing exercises and given her the National osteoporosis foundation recommendations.  She exercises ~5x a week to the gym. Stays active OTW, working in the yard. -Also, I recommended skeletal loading and gave her OsteoStrong brochure -did not try this. -She is trying to eat a low acid diet.  I explained that this helps her osteoporosis. -She is not smoking and does not drink  alcohol -We again discussed about fall precautions.  She is very careful. -At today's visit we will check a vitamin D -Reviewed most recent kidney function from 02/08/2021: GFR 58 -allowing Prolia continuation -For now, we will continue Prolia -She is due for another bone density scan - I ordered this.  We discussed that stable or increased T-scores are desirable.  - will see pt back in 1 year  2.  Vitamin D insufficiency -She continues on 2000 units vitamin D daily -At last visit vitamin D level was normal, at 52.8 -We will recheck her vitamin D level today  Component     Latest Ref Rng & Units 06/27/2021  VITD     30.00 - 100.00 ng/mL 67.64   Normal vitamin D level.  Philemon Kingdom, MD PhD [facility]

## 2021-07-10 ENCOUNTER — Telehealth: Payer: Self-pay

## 2021-07-10 NOTE — Telephone Encounter (Signed)
Pt called regarding Bone Density scan, the earliest she can be seen is July at the [facility]. Her next Prolia injection is scheduled for May. She wanted to know if you want to try sending her somewhere else to be seen sooner.

## 2021-07-10 NOTE — Telephone Encounter (Signed)
No!  It has to be on the same machine.  We can definitely continue with Prolia and do the bone density whenever they have an availability.  There is no rash.

## 2021-07-10 NOTE — Telephone Encounter (Signed)
Called and explained scan can be done after Prolia injection. Pt will call back to schedule next Prolia injection.

## 2021-07-21 DIAGNOSIS — T300 Burn of unspecified body region, unspecified degree: Secondary | ICD-10-CM | POA: Diagnosis not present

## 2021-07-21 DIAGNOSIS — Z23 Encounter for immunization: Secondary | ICD-10-CM | POA: Diagnosis not present

## 2021-08-09 DIAGNOSIS — I129 Hypertensive chronic kidney disease with stage 1 through stage 4 chronic kidney disease, or unspecified chronic kidney disease: Secondary | ICD-10-CM | POA: Diagnosis not present

## 2021-08-09 DIAGNOSIS — R7303 Prediabetes: Secondary | ICD-10-CM | POA: Diagnosis not present

## 2021-08-09 DIAGNOSIS — N183 Chronic kidney disease, stage 3 unspecified: Secondary | ICD-10-CM | POA: Diagnosis not present

## 2021-08-09 DIAGNOSIS — E782 Mixed hyperlipidemia: Secondary | ICD-10-CM | POA: Diagnosis not present

## 2021-08-14 NOTE — Telephone Encounter (Signed)
Prolia VOB initiated via [email or web address] ? ?Last OV: 06/27/21 ?Next OV:  ?Last Prolia inj: 03/31/21 ?Next Prolia inj DUE: 09/29/21 ? ?

## 2021-08-29 DIAGNOSIS — L570 Actinic keratosis: Secondary | ICD-10-CM | POA: Diagnosis not present

## 2021-08-29 DIAGNOSIS — D0439 Carcinoma in situ of skin of other parts of face: Secondary | ICD-10-CM | POA: Diagnosis not present

## 2021-08-29 NOTE — Telephone Encounter (Signed)
Prior auth required for PROLIA ? ?PA PROCESS DETAILS: PA is required. PA can be initiated by calling [phone] or online at ?[email or web address] ? ?

## 2021-09-11 NOTE — Telephone Encounter (Signed)
Pt ready for scheduling on or after 09/29/21 ? ?Out-of-pocket cost due at time of visit: $0 ? ?Primary: [employer] Medicare ?Prolia co-insurance: 0% ?Admin fee co-insurance: 0% ? ?Secondary: n/a ?Prolia co-insurance:  ?Admin fee co-insurance:  ? ?Deductible: does not apply ? ?Prior Auth: APPROVED ?PA# [id number] ?Valid: 09/07/19-05/13/22 ?  ? ?** This summary of benefits is an estimation of the patient's out-of-pocket cost. Exact cost may very based on individual plan coverage.  ? ?

## 2021-09-12 NOTE — Telephone Encounter (Signed)
Pt scheduled  

## 2021-10-01 DIAGNOSIS — N390 Urinary tract infection, site not specified: Secondary | ICD-10-CM | POA: Diagnosis not present

## 2021-10-01 DIAGNOSIS — R300 Dysuria: Secondary | ICD-10-CM | POA: Diagnosis not present

## 2021-10-03 ENCOUNTER — Ambulatory Visit (INDEPENDENT_AMBULATORY_CARE_PROVIDER_SITE_OTHER): Payer: Medicare PPO

## 2021-10-03 DIAGNOSIS — M810 Age-related osteoporosis without current pathological fracture: Secondary | ICD-10-CM

## 2021-10-03 MED ORDER — DENOSUMAB 60 MG/ML ~~LOC~~ SOSY
60.0000 mg | PREFILLED_SYRINGE | Freq: Once | SUBCUTANEOUS | Status: AC
  Administered 2021-10-03: 60 mg via SUBCUTANEOUS

## 2021-10-03 NOTE — Progress Notes (Signed)
Patient seen today for Prolia injection.  60mg/ml given in right arm subQ.  Patient tolerated well.  Follow up 6 months or as advised by provider.   Patient verified name, DOB and provided verbal consent prior to administration.  

## 2021-10-30 NOTE — Telephone Encounter (Signed)
Last Prolia inj 10/03/21 Next Prolia inj due 04/06/22

## 2021-11-28 ENCOUNTER — Ambulatory Visit
Admission: RE | Admit: 2021-11-28 | Discharge: 2021-11-28 | Disposition: A | Discharge status: Home / Self Care | Payer: Medicare PPO | Source: Ambulatory Visit | Attending: Internal Medicine | Admitting: Internal Medicine

## 2021-11-28 DIAGNOSIS — Z780 Asymptomatic menopausal state: Secondary | ICD-10-CM | POA: Diagnosis not present

## 2021-11-28 DIAGNOSIS — M810 Age-related osteoporosis without current pathological fracture: Secondary | ICD-10-CM

## 2021-11-28 DIAGNOSIS — M85852 Other specified disorders of bone density and structure, left thigh: Secondary | ICD-10-CM | POA: Diagnosis not present

## 2021-12-04 DIAGNOSIS — L578 Other skin changes due to chronic exposure to nonionizing radiation: Secondary | ICD-10-CM | POA: Diagnosis not present

## 2021-12-04 DIAGNOSIS — L821 Other seborrheic keratosis: Secondary | ICD-10-CM | POA: Diagnosis not present

## 2021-12-04 DIAGNOSIS — L814 Other melanin hyperpigmentation: Secondary | ICD-10-CM | POA: Diagnosis not present

## 2021-12-04 DIAGNOSIS — D485 Neoplasm of uncertain behavior of skin: Secondary | ICD-10-CM | POA: Diagnosis not present

## 2021-12-04 DIAGNOSIS — L570 Actinic keratosis: Secondary | ICD-10-CM | POA: Diagnosis not present

## 2021-12-04 DIAGNOSIS — C44329 Squamous cell carcinoma of skin of other parts of face: Secondary | ICD-10-CM | POA: Diagnosis not present

## 2021-12-04 DIAGNOSIS — D225 Melanocytic nevi of trunk: Secondary | ICD-10-CM | POA: Diagnosis not present

## 2021-12-04 DIAGNOSIS — Z85828 Personal history of other malignant neoplasm of skin: Secondary | ICD-10-CM | POA: Diagnosis not present

## 2021-12-26 DIAGNOSIS — C44329 Squamous cell carcinoma of skin of other parts of face: Secondary | ICD-10-CM | POA: Diagnosis not present

## 2022-01-10 DIAGNOSIS — C44329 Squamous cell carcinoma of skin of other parts of face: Secondary | ICD-10-CM | POA: Diagnosis not present

## 2022-02-06 ENCOUNTER — Other Ambulatory Visit: Payer: Self-pay | Admitting: Family Medicine

## 2022-02-06 DIAGNOSIS — Z1231 Encounter for screening mammogram for malignant neoplasm of breast: Secondary | ICD-10-CM

## 2022-02-13 DIAGNOSIS — Z5189 Encounter for other specified aftercare: Secondary | ICD-10-CM | POA: Diagnosis not present

## 2022-02-13 DIAGNOSIS — C44329 Squamous cell carcinoma of skin of other parts of face: Secondary | ICD-10-CM | POA: Diagnosis not present

## 2022-02-17 NOTE — Telephone Encounter (Signed)
Prolia VOB initiated via [email or web address]  Last Prolia inj 10/03/21 Next Prolia inj due 04/06/22

## 2022-02-21 DIAGNOSIS — I129 Hypertensive chronic kidney disease with stage 1 through stage 4 chronic kidney disease, or unspecified chronic kidney disease: Secondary | ICD-10-CM | POA: Diagnosis not present

## 2022-02-21 DIAGNOSIS — Z0000 Encounter for general adult medical examination without abnormal findings: Secondary | ICD-10-CM | POA: Diagnosis not present

## 2022-02-21 DIAGNOSIS — E782 Mixed hyperlipidemia: Secondary | ICD-10-CM | POA: Diagnosis not present

## 2022-02-21 DIAGNOSIS — M8580 Other specified disorders of bone density and structure, unspecified site: Secondary | ICD-10-CM | POA: Diagnosis not present

## 2022-02-21 DIAGNOSIS — R7303 Prediabetes: Secondary | ICD-10-CM | POA: Diagnosis not present

## 2022-02-21 DIAGNOSIS — N183 Chronic kidney disease, stage 3 unspecified: Secondary | ICD-10-CM | POA: Diagnosis not present

## 2022-02-21 DIAGNOSIS — K219 Gastro-esophageal reflux disease without esophagitis: Secondary | ICD-10-CM | POA: Diagnosis not present

## 2022-02-21 DIAGNOSIS — J301 Allergic rhinitis due to pollen: Secondary | ICD-10-CM | POA: Diagnosis not present

## 2022-02-21 DIAGNOSIS — M1990 Unspecified osteoarthritis, unspecified site: Secondary | ICD-10-CM | POA: Diagnosis not present

## 2022-03-09 NOTE — Telephone Encounter (Signed)
Pt ready for scheduling on or after 04/06/22   Out-of-pocket cost due at time of visit: $40   Primary: [employer] Medicare Adv PPO Prolia co-insurance: 0% Admin fee co-insurance: $40   Secondary: n/a Prolia co-insurance:  Admin fee co-insurance:    Deductible: does not apply   Prior Auth: APPROVED PA# [id number] Valid: 09/07/19-05/13/22     ** This summary of benefits is an estimation of the patient's out-of-pocket cost. Exact cost may very based on individual plan coverage.

## 2022-03-12 NOTE — Telephone Encounter (Signed)
Left message for patient to return call to schedule appointment for Prolia injection.

## 2022-03-14 DIAGNOSIS — H18593 Other hereditary corneal dystrophies, bilateral: Secondary | ICD-10-CM | POA: Diagnosis not present

## 2022-03-14 DIAGNOSIS — H43813 Vitreous degeneration, bilateral: Secondary | ICD-10-CM | POA: Diagnosis not present

## 2022-03-14 DIAGNOSIS — H02831 Dermatochalasis of right upper eyelid: Secondary | ICD-10-CM | POA: Diagnosis not present

## 2022-03-14 DIAGNOSIS — H04123 Dry eye syndrome of bilateral lacrimal glands: Secondary | ICD-10-CM | POA: Diagnosis not present

## 2022-03-16 ENCOUNTER — Ambulatory Visit: Payer: Medicare PPO

## 2022-03-21 DIAGNOSIS — C44329 Squamous cell carcinoma of skin of other parts of face: Secondary | ICD-10-CM | POA: Diagnosis not present

## 2022-03-21 DIAGNOSIS — Z8589 Personal history of malignant neoplasm of other organs and systems: Secondary | ICD-10-CM | POA: Diagnosis not present

## 2022-03-21 DIAGNOSIS — Z5189 Encounter for other specified aftercare: Secondary | ICD-10-CM | POA: Diagnosis not present

## 2022-03-22 ENCOUNTER — Ambulatory Visit
Admission: RE | Admit: 2022-03-22 | Discharge: 2022-03-22 | Disposition: A | Discharge status: Home / Self Care | Payer: Medicare PPO | Source: Ambulatory Visit | Attending: Family Medicine | Admitting: Family Medicine

## 2022-03-22 DIAGNOSIS — Z1231 Encounter for screening mammogram for malignant neoplasm of breast: Secondary | ICD-10-CM

## 2022-04-10 ENCOUNTER — Ambulatory Visit (INDEPENDENT_AMBULATORY_CARE_PROVIDER_SITE_OTHER): Payer: Medicare PPO

## 2022-04-10 VITALS — BP 124/78 | Wt 138.0 lb

## 2022-04-10 DIAGNOSIS — M810 Age-related osteoporosis without current pathological fracture: Secondary | ICD-10-CM | POA: Diagnosis not present

## 2022-04-10 MED ORDER — DENOSUMAB 60 MG/ML ~~LOC~~ SOSY
60.0000 mg | PREFILLED_SYRINGE | Freq: Once | SUBCUTANEOUS | Status: AC
  Administered 2022-04-10: 60 mg via SUBCUTANEOUS

## 2022-04-10 NOTE — Progress Notes (Signed)
After obtaining consent, and per orders of Dr. Cruzita Lederer, injection of Prolia '60MG'$  given by Mitsuko Luera L Perian Tedder in left arm SQ. Patient instructed to remain in clinic for 20 minutes afterwards, and to report any adverse reaction to me immediately.

## 2022-04-27 DIAGNOSIS — Z1159 Encounter for screening for other viral diseases: Secondary | ICD-10-CM | POA: Diagnosis not present

## 2022-04-28 NOTE — Telephone Encounter (Signed)
Last Prolia inj 04/10/22 Next Prolia inj due 10/10/22

## 2022-06-28 ENCOUNTER — Encounter: Payer: Self-pay | Admitting: Internal Medicine

## 2022-06-28 ENCOUNTER — Ambulatory Visit: Payer: Medicare PPO | Admitting: Internal Medicine

## 2022-06-28 VITALS — BP 120/68 | HR 76 | Ht 63.0 in | Wt 139.8 lb

## 2022-06-28 DIAGNOSIS — E559 Vitamin D deficiency, unspecified: Secondary | ICD-10-CM | POA: Diagnosis not present

## 2022-06-28 DIAGNOSIS — M810 Age-related osteoporosis without current pathological fracture: Secondary | ICD-10-CM | POA: Diagnosis not present

## 2022-06-28 LAB — BASIC METABOLIC PANEL
BUN: 14 mg/dL (ref 6–23)
CO2: 31 mEq/L (ref 19–32)
Calcium: 9.8 mg/dL (ref 8.4–10.5)
Chloride: 102 mEq/L (ref 96–112)
Creatinine, Ser: 0.99 mg/dL (ref 0.40–1.20)
GFR: 51.63 mL/min — ABNORMAL LOW (ref >60.00)
Glucose, Bld: 86 mg/dL (ref 70–99)
Potassium: 3.9 mEq/L (ref 3.5–5.1)
Sodium: 142 mEq/L (ref 135–145)

## 2022-06-28 LAB — VITAMIN D 25 HYDROXY (VIT D DEFICIENCY, FRACTURES): VITD: 52.83 ng/mL (ref 30.00–100.00)

## 2022-06-28 NOTE — Progress Notes (Signed)
Patient ID: Desiree Caldwell, female   DOB: 29-Jan-1934, 89 y.o.   MRN: [id number]   HPI  Desiree Caldwell is a 89 y.o.-year-old delightfulful female, initially referred by her PCP, Dr. Brigitte Pulse, returning for follow-up for clinical osteoporosis.  Last visit a year ago.  Interim history: No falls or fractures since last visit. No vertigo/orthostasis/poor vision.   She was found to have cervical spondylosis >> leg weakness >> PT >> resolved. She still goes to the [facility] at least 3x a week.  Reviewed and addended history: She was diagnosed with osteopenia on DXA scan in 2014.  She has clinical osteoporosis due to the high FRAX score.  Review latest DXA scan reports: Date L1-L4 T score FN T score 33% distal Radius Ultra distal radius FRAX score  11/28/2021 ([facility]) +0.8 LFN: -2.2 -2.9 -3.7 N/a  04/07/2019 [facility])  0.0 RFN: n/a (history of hip replacement) LFN: -2.4 n/a N/a 10-year MOF: 26.5% 10-year hip fracture risk: 8.9%   Her only fracture was a left 5th metatarsal fracture in 2011 - misplaced foot when coming down stairs.    She has a history of right total hip replacement in 2016.  Previous OP treatments:  - Evista 2014-2016 - Prolia -tolerating well: 09/17/2019 03/24/2020 09/27/2020 04/01/2021 10/03/2021 04/10/2022  Reviewed her vitamin D levels: Lab Results  Component Value Date   VD25OH 67.64 06/27/2021   VD25OH 52.8 06/29/2020   VD25OH 61.97 08/27/2019   VD25OH 25.34 (L) 06/24/2019   She is not on calcium supplements. She takes vitamin D 2000 units daily, started 06/2019. She likes to go to the beach often.  Diet: - Breakfast: Toast; oatmeal; cereal - Lunch: Half a sandwich or occasionally eating out - Dinner: Meat with vegetables or vegetarian meal - Snacks: Usually 1 a day: Cheese, fruit, nuts  She is doing weightbearing exercise - at the [facility], also walking on the treadmill and using the stationary bike. She tries to go to the gym 5 times a  week.  She does not take high vitamin A doses.  Menopause was at 89 y/o.  She has a history of TAH (6856, at 89 years old) and BSO (in 5254, at 89 years old).   She denies FH of osteoporosis.  No history of kidney stones, hyper or hypocalcemia or hyperparathyroidism: 08/09/2021: Corrected calcium 9.4 02/08/2021: Ca 9.76 Lab Results  Component Value Date   CALCIUM 9.8 06/29/2020   CALCIUM 9.7 06/24/2019   CALCIUM 8.8 06/23/2014   CALCIUM 8.8 06/22/2014   CALCIUM 9.7 06/14/2014  01/14/2019: Calcium 9.4 (8.6-10.3)  No history of thyrotoxicosis: Lab Results  Component Value Date   TSH 3.92 06/24/2019    She has stage II-III hypertensive CKD. Last BUN/Cr: 08/09/2021: 13/0.96, GFR 58 Lab Results  Component Value Date   BUN 17 06/29/2020   CREATININE 1.03 06/29/2020  01/14/2019: 12/0.94, GFR 67  She also has a history of HTN, HL, type 2 diabetes, GERD.  Daughter has melanoma and ThyCA.  ROS: + see HPI + acid reflux  I reviewed pt's medications, allergies, PMH, social hx, family hx, and changes were documented in the history of present illness. Otherwise, unchanged from my initial visit note.  Past Medical History:  Diagnosis Date   Arthritis    Complication of anesthesia    SLOW TO WAKE UP   Difficulty sleeping    DUE TO PAIN   Diverticulosis    GERD (gastroesophageal reflux disease)    Hyperlipidemia    Hypertension  Osteopenia    Seasonal allergies    Past Surgical History:  Procedure Laterality Date   ABDOMINAL HYSTERECTOMY     > 20 YRS AGO    BILATERAL OOPHORECTOMY  1991   BREAST BIOPSY Right 11/24/2014    benign   TOTAL HIP ARTHROPLASTY Right 06/21/2014   Procedure: RIGHT TOTAL HIP ARTHROPLASTY ANTERIOR APPROACH;  Surgeon: Gearlean Alf, MD;  Location: [facility];  Service: Orthopedics;  Laterality: Right;   Social History   Socioeconomic History   Marital status: Divorced    Spouse name: Not on file   Number of children: 1- daughter    Years of  education: Not on file   Highest education level: Not on file  Occupational History    Retired Art therapist  Tobacco Use   Smoking status: Never Smoker  Substance and Sexual Activity   Alcohol use: Yes    Comment: RARE   Drug use: No   Sexual activity: Not on file  Other Topics Concern   Not on file  Social History Narrative   Not on file   Social Determinants of Health   Financial Resource Strain:    Difficulty of Paying Living Expenses: Not on file  Food Insecurity:    Worried About Charity fundraiser in the Last Year: Not on file   [employer] of Food in the Last Year: Not on file  Transportation Needs:    Lack of Transportation (Medical): Not on file   Lack of Transportation (Non-Medical): Not on file  Physical Activity:    Days of Exercise per Week: Not on file   Minutes of Exercise per Session: Not on file  Stress:    Feeling of Stress : Not on file  Social Connections:    Frequency of Communication with Friends and Family: Not on file   Frequency of Social Gatherings with Friends and Family: Not on file   Attends Religious Services: Not on file   Active Member of Clubs or Organizations: Not on file   Attends Archivist Meetings: Not on file   Marital Status: Not on file  Intimate Partner Violence:    Fear of Current or Ex-Partner: Not on file   Emotionally Abused: Not on file   Physically Abused: Not on file   Sexually Abused: Not on file   Current Outpatient Medications on File Prior to Visit  Medication Sig Dispense Refill   aspirin EC 81 MG tablet Take 81 mg by mouth daily.     fluticasone (FLONASE) 50 MCG/ACT nasal spray Place 2 sprays into both nostrils every morning.     hydrochlorothiazide (HYDRODIURIL) 25 MG tablet Take 12.5 mg by mouth every morning.     omeprazole (PRILOSEC) 20 MG capsule Take 20 mg by mouth every morning.     simvastatin (ZOCOR) 80 MG tablet Take 80 mg by mouth at bedtime.     No current facility-administered medications  on file prior to visit.   No Known Allergies Family History  Problem Relation Age of Onset   Breast cancer Neg Hx    PE: BP 120/68 (BP Location: Right Arm, Patient Position: Sitting, Cuff Size: Normal)   Pulse 76   Ht 5' 3"$  (1.6 m)   Wt 139 lb 12.8 oz (63.4 kg)   SpO2 97%   BMI 24.76 kg/m  Wt Readings from Last 3 Encounters:  06/28/22 139 lb 12.8 oz (63.4 kg)  04/10/22 138 lb (62.6 kg)  06/27/21 136 lb 12.8 oz (62.1 kg)  Constitutional: normal weight, in NAD, appears younger than stated age Eyes:  EOMI, no exophthalmos ENT: no neck masses, no cervical lymphadenopathy Cardiovascular: RRR, No MRG Respiratory: CTA B Musculoskeletal: no deformities Skin:no rashes Neurological: no tremor with outstretched hands  Assessment: 1.  Clinical osteoporosis  2.  Vitamin D insufficiency  Plan: 1.  Clinical osteoporosis -Likely postmenopausal/age-related, but also possibly due to early TAH (although she had BSO only after she became menopausal).  No family history of osteoporosis. -No falls or fractures since last visit -We reviewed together the latest bone density scan from 11/2021: T-score was in the osteoporotic range (-2.9) at the 33% distal radius and was also very low, and -3.7 at the ultra distal radius, which is a surrogate marker for trabecular bone.  We do not have previous ultra distal radius scores to compare -Due to her history of GERD, she was hesitant to take p.o. medications for osteoporosis.  She is on a PPI, taken in the morning. -We started Prolia in 09/2019.  She continues with this and does not miss doses.  She had a total of 6 injections.  No side effects from it.  No thigh/hip pain.  She sees her dentist every 6 months.  Annual appointment is coming up. -He is getting approximately 1000-1200 mg of calcium daily from the diet.  No need for further supplementation. -I previously recommended weightbearing exercises and given her the National osteoporosis foundation  recommendations.  She still exercises approximately 3-5 times a week at the gym.   -She did not try OsteoStrong skeletal loading (given brochure in the past) -She is trying to eat a low acid diet -Not smoking or drinking alcohol -She is aware about fall precautions.  She is very careful not to fall. - reviewed the most recent kidney function from 08/09/2021-mild decrease -will repeat this now -Will continue Prolia for now -Will see her back in a year  2.  Vitamin D insufficiency -On 2000 units vitamin D daily -At last visit, vitamin D was normal, and 67.64 -We will recheck the level today  Component     Latest Ref Rng 06/28/2022  Sodium     135 - 145 mEq/L 142   Potassium     3.5 - 5.1 mEq/L 3.9   Chloride     96 - 112 mEq/L 102   CO2     19 - 32 mEq/L 31   Glucose     70 - 99 mg/dL 86   BUN     6 - 23 mg/dL 14   Creatinine     0.40 - 1.20 mg/dL 0.99   GFR     >60.00 mL/min 51.63 (L)   Calcium     8.4 - 10.5 mg/dL 9.8   VITD     30.00 - 100.00 ng/mL 52.83    Philemon Kingdom, MD PhD [facility]

## 2022-06-28 NOTE — Patient Instructions (Signed)
Please stop at the lab.  Continue Prolia.  Please come back for a follow-up appointment in 1 year.

## 2022-07-11 DIAGNOSIS — D0422 Carcinoma in situ of skin of left ear and external auricular canal: Secondary | ICD-10-CM | POA: Diagnosis not present

## 2022-07-11 DIAGNOSIS — L570 Actinic keratosis: Secondary | ICD-10-CM | POA: Diagnosis not present

## 2022-07-11 DIAGNOSIS — D485 Neoplasm of uncertain behavior of skin: Secondary | ICD-10-CM | POA: Diagnosis not present

## 2022-07-19 DIAGNOSIS — K219 Gastro-esophageal reflux disease without esophagitis: Secondary | ICD-10-CM | POA: Diagnosis not present

## 2022-07-19 DIAGNOSIS — R7303 Prediabetes: Secondary | ICD-10-CM | POA: Diagnosis not present

## 2022-07-19 DIAGNOSIS — M79602 Pain in left arm: Secondary | ICD-10-CM | POA: Diagnosis not present

## 2022-07-19 DIAGNOSIS — I129 Hypertensive chronic kidney disease with stage 1 through stage 4 chronic kidney disease, or unspecified chronic kidney disease: Secondary | ICD-10-CM | POA: Diagnosis not present

## 2022-07-19 DIAGNOSIS — N1831 Chronic kidney disease, stage 3a: Secondary | ICD-10-CM | POA: Diagnosis not present

## 2022-07-31 DIAGNOSIS — C44229 Squamous cell carcinoma of skin of left ear and external auricular canal: Secondary | ICD-10-CM | POA: Diagnosis not present

## 2022-08-16 NOTE — Telephone Encounter (Signed)
Prolia VOB initiated via [email or web address]  Last OV: 06/28/22 Next OV:  Last Prolia inj 04/10/22 Next Prolia inj due 10/10/22

## 2022-08-21 DIAGNOSIS — R6884 Jaw pain: Secondary | ICD-10-CM | POA: Diagnosis not present

## 2022-08-21 DIAGNOSIS — E782 Mixed hyperlipidemia: Secondary | ICD-10-CM | POA: Diagnosis not present

## 2022-08-21 DIAGNOSIS — N183 Chronic kidney disease, stage 3 unspecified: Secondary | ICD-10-CM | POA: Diagnosis not present

## 2022-08-21 DIAGNOSIS — R7303 Prediabetes: Secondary | ICD-10-CM | POA: Diagnosis not present

## 2022-08-21 DIAGNOSIS — I129 Hypertensive chronic kidney disease with stage 1 through stage 4 chronic kidney disease, or unspecified chronic kidney disease: Secondary | ICD-10-CM | POA: Diagnosis not present

## 2022-08-23 DIAGNOSIS — Z85828 Personal history of other malignant neoplasm of skin: Secondary | ICD-10-CM | POA: Diagnosis not present

## 2022-08-23 DIAGNOSIS — Z5189 Encounter for other specified aftercare: Secondary | ICD-10-CM | POA: Diagnosis not present

## 2022-08-23 NOTE — Telephone Encounter (Signed)
Prior auth required for PROLIA - expired 05/13/22   PA PROCESS DETAILS: PA is required. PA can be initiated by calling [phone] or online at [email or web address]

## 2022-08-27 NOTE — Telephone Encounter (Signed)
Prior Authorization initiated for PROLIA via [email or web address] KEY: [id number]

## 2022-08-28 NOTE — Telephone Encounter (Signed)
APPROVED  Key: [id number] PA Case ID #: [id number] Valid: 09/07/19-05/14/23

## 2022-08-28 NOTE — Telephone Encounter (Signed)
Pt ready for scheduling on or after 10/10/22  Out-of-pocket cost due at time of visit: $40  Primary: [employer] Medicare Adv [place] [facility] Prolia co-insurance: $40 Admin fee co-insurance: 0%  Deductible: does not apply  Prior Auth: APPROVED Key: [id number] PA Case ID #: [id number] Valid: 09/07/19-05/14/23  Secondary: N/A Prolia co-insurance:  Admin fee co-insurance:  Deductible:  Prior Auth:  PA# Valid:     ** This summary of benefits is an estimation of the patient's out-of-pocket cost. Exact cost may very based on individual plan coverage.

## 2022-08-29 NOTE — Telephone Encounter (Signed)
Patient is scheduled 10/16/2022 for next Prolia injection.

## 2022-08-29 NOTE — Telephone Encounter (Signed)
LMx1 for patient to schedule next Prolia injection after 10/10/2022.

## 2022-09-03 ENCOUNTER — Telehealth: Payer: Self-pay | Admitting: Pharmacy Technician

## 2022-09-03 NOTE — Telephone Encounter (Signed)
Pharmacy Patient Advocate Encounter  Prior Authorization for Prolia has been approved by [employer] (ins).    PA # [id number] Effective dates: 09/07/19 through 05/14/23

## 2022-10-16 ENCOUNTER — Ambulatory Visit: Payer: Medicare PPO

## 2022-10-16 VITALS — BP 124/70 | HR 80 | Ht 63.0 in | Wt 137.0 lb

## 2022-10-16 DIAGNOSIS — M810 Age-related osteoporosis without current pathological fracture: Secondary | ICD-10-CM

## 2022-10-16 MED ORDER — DENOSUMAB 60 MG/ML ~~LOC~~ SOSY
60.0000 mg | PREFILLED_SYRINGE | Freq: Once | SUBCUTANEOUS | Status: AC
Start: 2022-10-16 — End: 2022-10-16
  Administered 2022-10-16: 60 mg via SUBCUTANEOUS

## 2022-10-16 NOTE — Progress Notes (Signed)
After obtaining consent, and per orders of Dr. Gherghe, injection of Prolia 60 mg  given by Karolyna Bianchini L Clifton Kovacic in left arm SQ. Patient instructed to remain in clinic for 20 minutes afterwards, and to report any adverse reaction to me immediately.  

## 2022-10-30 NOTE — Telephone Encounter (Signed)
Last Prolia inj 10/16/22 Next Prolia inj due 04/18/23 

## 2022-12-10 DIAGNOSIS — L578 Other skin changes due to chronic exposure to nonionizing radiation: Secondary | ICD-10-CM | POA: Diagnosis not present

## 2022-12-10 DIAGNOSIS — L570 Actinic keratosis: Secondary | ICD-10-CM | POA: Diagnosis not present

## 2022-12-10 DIAGNOSIS — D225 Melanocytic nevi of trunk: Secondary | ICD-10-CM | POA: Diagnosis not present

## 2022-12-10 DIAGNOSIS — L814 Other melanin hyperpigmentation: Secondary | ICD-10-CM | POA: Diagnosis not present

## 2022-12-10 DIAGNOSIS — Z85828 Personal history of other malignant neoplasm of skin: Secondary | ICD-10-CM | POA: Diagnosis not present

## 2022-12-10 DIAGNOSIS — L821 Other seborrheic keratosis: Secondary | ICD-10-CM | POA: Diagnosis not present

## 2023-02-13 ENCOUNTER — Other Ambulatory Visit: Payer: Self-pay | Admitting: Family Medicine

## 2023-02-13 DIAGNOSIS — Z1231 Encounter for screening mammogram for malignant neoplasm of breast: Secondary | ICD-10-CM

## 2023-02-25 DIAGNOSIS — M8580 Other specified disorders of bone density and structure, unspecified site: Secondary | ICD-10-CM | POA: Diagnosis not present

## 2023-02-25 DIAGNOSIS — E782 Mixed hyperlipidemia: Secondary | ICD-10-CM | POA: Diagnosis not present

## 2023-02-25 DIAGNOSIS — Z0000 Encounter for general adult medical examination without abnormal findings: Secondary | ICD-10-CM | POA: Diagnosis not present

## 2023-02-25 DIAGNOSIS — J301 Allergic rhinitis due to pollen: Secondary | ICD-10-CM | POA: Diagnosis not present

## 2023-02-25 DIAGNOSIS — R7303 Prediabetes: Secondary | ICD-10-CM | POA: Diagnosis not present

## 2023-02-25 DIAGNOSIS — M1990 Unspecified osteoarthritis, unspecified site: Secondary | ICD-10-CM | POA: Diagnosis not present

## 2023-02-25 DIAGNOSIS — N183 Chronic kidney disease, stage 3 unspecified: Secondary | ICD-10-CM | POA: Diagnosis not present

## 2023-02-25 DIAGNOSIS — I129 Hypertensive chronic kidney disease with stage 1 through stage 4 chronic kidney disease, or unspecified chronic kidney disease: Secondary | ICD-10-CM | POA: Diagnosis not present

## 2023-02-25 DIAGNOSIS — K219 Gastro-esophageal reflux disease without esophagitis: Secondary | ICD-10-CM | POA: Diagnosis not present

## 2023-03-19 DIAGNOSIS — M25562 Pain in left knee: Secondary | ICD-10-CM | POA: Diagnosis not present

## 2023-03-19 DIAGNOSIS — M25552 Pain in left hip: Secondary | ICD-10-CM | POA: Diagnosis not present

## 2023-03-20 DIAGNOSIS — H524 Presbyopia: Secondary | ICD-10-CM | POA: Diagnosis not present

## 2023-03-25 ENCOUNTER — Ambulatory Visit: Payer: Medicare PPO

## 2023-04-03 NOTE — Telephone Encounter (Signed)
Prolia VOB initiated via [email or web address]  Next Prolia inj DUE: 04/18/23

## 2023-04-09 NOTE — Telephone Encounter (Signed)
Prior Auth REQUIRED for [employer]

## 2023-04-12 NOTE — Telephone Encounter (Signed)
Prior Auth on file and valid:  Prior Auth: APPROVED Key: [id number] PA Case ID #: [id number] Valid: 09/07/19-05/14/23

## 2023-04-22 NOTE — Telephone Encounter (Signed)
Pt ready for scheduling on or after 04/18/23  Out-of-pocket cost due at time of visit: $40  Primary: [employer] SHP  Prolia co-insurance: $40 Admin fee co-insurance: 0%  Deductible: doe snot apply  Prior Auth APPROVED [place] Case ID #: [id number] Valid: 09/07/19-05/14/23  Secondary: N/A Prolia co-insurance:  Admin fee co-insurance:  Deductible:  Prior Auth:  PA# Valid:     ** This summary of benefits is an estimation of the patient's out-of-pocket cost. Exact cost may very based on individual plan coverage.

## 2023-04-23 ENCOUNTER — Ambulatory Visit
Admission: RE | Admit: 2023-04-23 | Discharge: 2023-04-23 | Disposition: A | Discharge status: Home / Self Care | Payer: Medicare PPO | Source: Ambulatory Visit | Attending: Family Medicine | Admitting: Family Medicine

## 2023-04-23 DIAGNOSIS — Z1231 Encounter for screening mammogram for malignant neoplasm of breast: Secondary | ICD-10-CM

## 2023-04-24 ENCOUNTER — Ambulatory Visit: Payer: Medicare PPO

## 2023-04-24 DIAGNOSIS — M810 Age-related osteoporosis without current pathological fracture: Secondary | ICD-10-CM | POA: Diagnosis not present

## 2023-04-24 MED ORDER — DENOSUMAB 60 MG/ML ~~LOC~~ SOSY
60.0000 mg | PREFILLED_SYRINGE | Freq: Once | SUBCUTANEOUS | Status: AC
Start: 2023-04-24 — End: 2023-04-24
  Administered 2023-04-24: 60 mg via SUBCUTANEOUS

## 2023-04-24 MED ORDER — DENOSUMAB 60 MG/ML ~~LOC~~ SOSY
60.0000 mg | PREFILLED_SYRINGE | Freq: Once | SUBCUTANEOUS | Status: AC
Start: 2023-10-23 — End: ?
  Administered 2023-10-25: 60 mg via SUBCUTANEOUS

## 2023-04-24 NOTE — Telephone Encounter (Signed)
Patient advising every time she pays pre payment she receives the payment back from [facility]. She is asking why does she need to continue to pay if it always get sent back to her

## 2023-04-24 NOTE — Progress Notes (Signed)
After obtaining consent, and per orders of Dr. Elvera Lennox, injection of Prolia 60 mg given by Pollie Meyer. Patient instructed to remain in clinic for 20 minutes afterwards, and to report any adverse reaction to me immediately.

## 2023-04-30 NOTE — Telephone Encounter (Signed)
Last Prolia inj 04/24/23 Next Prolia inj due 10/24/23

## 2023-05-14 DIAGNOSIS — M25552 Pain in left hip: Secondary | ICD-10-CM | POA: Diagnosis not present

## 2023-05-16 DIAGNOSIS — M1612 Unilateral primary osteoarthritis, left hip: Secondary | ICD-10-CM | POA: Diagnosis not present

## 2023-07-01 ENCOUNTER — Ambulatory Visit: Payer: Medicare PPO | Admitting: Internal Medicine

## 2023-07-01 ENCOUNTER — Encounter: Payer: Self-pay | Admitting: Internal Medicine

## 2023-07-01 VITALS — BP 118/64 | HR 71 | Ht 63.0 in | Wt 140.2 lb

## 2023-07-01 DIAGNOSIS — E559 Vitamin D deficiency, unspecified: Secondary | ICD-10-CM | POA: Diagnosis not present

## 2023-07-01 DIAGNOSIS — M810 Age-related osteoporosis without current pathological fracture: Secondary | ICD-10-CM | POA: Diagnosis not present

## 2023-07-01 NOTE — Patient Instructions (Signed)
Please stop at the lab.  Continue Prolia.  Please come back for a follow-up appointment in 1 year. 

## 2023-07-01 NOTE — Progress Notes (Signed)
Patient ID: Desiree Caldwell, female   DOB: Jun 06, 1933, 89 y.o.   MRN: [id number]   HPI  Desiree Caldwell is a 89 y.o.-year-old delightful female, initially referred by her PCP, Dr. Clelia Croft, returning for follow-up for clinical osteoporosis.  Last visit a year ago.  Interim history: No falls or fractures since last visit. No vertigo/orthostasis/poor vision.   She has L knee pain. Her L leg started giving way >> She is preparing for left total hip replacement 07/17/2023.  She still goes to the [facility] 3-5x a week.  Reviewed and addended history: She was diagnosed with osteopenia on DXA scan in 2014.  She has clinical osteoporosis due to the high FRAX score.  Review latest DXA scan reports: Date L1-L4 T score FN T score 33% distal Radius Ultra distal radius FRAX score  11/28/2021 ([facility]) +0.8 LFN: -2.2 -2.9 -3.7 N/a  04/07/2019 [facility] imaging)  0.0 RFN: n/a (history of hip replacement) LFN: -2.4 n/a N/a 10-year MOF: 26.5% 10-year hip fracture risk: 8.9%   Her only fracture was a left 5th metatarsal fracture in 2011 - misplaced foot when coming down stairs.    She has a history of right total hip replacement in 2016.  Previous OP treatments:  - Evista 2014-2016 - Prolia -tolerating it well: 09/17/2019 03/24/2020 09/27/2020 04/01/2021 10/03/2021 04/10/2022 10/16/2022 04/24/2023  Reviewed her vitamin D levels: Lab Results  Component Value Date   VD25OH 52.83 06/28/2022   VD25OH 67.64 06/27/2021   VD25OH 52.8 06/29/2020   VD25OH 61.97 08/27/2019   VD25OH 25.34 (L) 06/24/2019   She is not on calcium supplements. She takes vitamin D 2000 units daily, started 06/2019. She likes to go to the beach often.  Diet: - Breakfast: Toast; oatmeal; cereal - Lunch: Half a sandwich or occasionally eating out - Dinner: Meat with vegetables or vegetarian meal - Snacks: Usually 1 a day: Cheese, fruit, nuts  She is doing weightbearing exercise - at the [facility], also walking on the  treadmill and using the stationary bike. She tries to go to the gym 5 times a week.  She does not take high vitamin A doses.  Menopause was at 89 y/o.  She has a history of TAH (3343, at 89 years old) and BSO (in 6641, at 89 years old).   She denies FH of osteoporosis.  No history of kidney stones, hyper or hypocalcemia or hyperparathyroidism:  Lab Results  Component Value Date   CALCIUM 9.8 06/28/2022   CALCIUM 9.8 06/29/2020   CALCIUM 9.7 06/24/2019   CALCIUM 8.8 06/23/2014   CALCIUM 8.8 06/22/2014   CALCIUM 9.7 06/14/2014   No history of thyrotoxicosis: Lab Results  Component Value Date   TSH 3.92 06/24/2019    She has stage II-III hypertensive CKD. She also has a history of HTN, HL, type 2 diabetes, GERD.  Daughter has melanoma and ThyCA.  Patient's mother had goiter.  ROS: + see HPI + acid reflux  I reviewed pt's medications, allergies, PMH, social hx, family hx, and changes were documented in the history of present illness. Otherwise, unchanged from my initial visit note.  Past Medical History:  Diagnosis Date   Arthritis    Complication of anesthesia    SLOW TO WAKE UP   Difficulty sleeping    DUE TO PAIN   Diverticulosis    GERD (gastroesophageal reflux disease)    Hyperlipidemia    Hypertension    Osteopenia    Seasonal allergies    Past Surgical History:  Procedure Laterality Date   ABDOMINAL HYSTERECTOMY     > 20 YRS AGO    BILATERAL OOPHORECTOMY  1991   BREAST BIOPSY Right 11/24/2014    benign   TOTAL HIP ARTHROPLASTY Right 06/21/2014   Procedure: RIGHT TOTAL HIP ARTHROPLASTY ANTERIOR APPROACH;  Surgeon: Loanne Drilling, MD;  Location: [facility];  Service: Orthopedics;  Laterality: Right;   Social History   Socioeconomic History   Marital status: Divorced    Spouse name: Not on file   Number of children: 1- daughter    Years of education: Not on file   Highest education level: Not on file  Occupational History    Retired IT sales professional   Tobacco Use   Smoking status: Never Smoker  Substance and Sexual Activity   Alcohol use: Yes    Comment: RARE   Drug use: No   Sexual activity: Not on file  Other Topics Concern   Not on file  Social History Narrative   Not on file   Social Determinants of Health   Financial Resource Strain:    Difficulty of Paying Living Expenses: Not on file  Food Insecurity:    Worried About Programme researcher, broadcasting/film/video in the Last Year: Not on file   [employer] of Food in the Last Year: Not on file  Transportation Needs:    Lack of Transportation (Medical): Not on file   Lack of Transportation (Non-Medical): Not on file  Physical Activity:    Days of Exercise per Week: Not on file   Minutes of Exercise per Session: Not on file  Stress:    Feeling of Stress : Not on file  Social Connections:    Frequency of Communication with Friends and Family: Not on file   Frequency of Social Gatherings with Friends and Family: Not on file   Attends Religious Services: Not on file   Active Member of Clubs or Organizations: Not on file   Attends Banker Meetings: Not on file   Marital Status: Not on file  Intimate Partner Violence:    Fear of Current or Ex-Partner: Not on file   Emotionally Abused: Not on file   Physically Abused: Not on file   Sexually Abused: Not on file   Current Outpatient Medications on File Prior to Visit  Medication Sig Dispense Refill   aspirin EC 81 MG tablet Take 81 mg by mouth daily.     fluticasone (FLONASE) 50 MCG/ACT nasal spray Place 2 sprays into both nostrils every morning.     hydrochlorothiazide (HYDRODIURIL) 25 MG tablet Take 12.5 mg by mouth every morning.     omeprazole (PRILOSEC) 20 MG capsule Take 20 mg by mouth every morning.     simvastatin (ZOCOR) 80 MG tablet Take 80 mg by mouth at bedtime.     Current Facility-Administered Medications on File Prior to Visit  Medication Dose Route Frequency Provider Last Rate Last Admin   [START ON 10/23/2023]  denosumab (PROLIA) injection 60 mg  60 mg Subcutaneous Once Carlus Pavlov, MD       No Known Allergies Family History  Problem Relation Age of Onset   Breast cancer Neg Hx    BRCA 1/2 Neg Hx    PE: BP 118/64   Pulse 71   Ht 5\' 3"  (1.6 m)   Wt 140 lb 3.2 oz (63.6 kg)   SpO2 93%   BMI 24.84 kg/m  Wt Readings from Last 3 Encounters:  07/01/23 140 lb 3.2 oz (63.6  kg)  10/16/22 137 lb (62.1 kg)  06/28/22 139 lb 12.8 oz (63.4 kg)   Constitutional: normal weight, in NAD, appears younger than stated age, walks without a cane or walker Eyes:  EOMI, no exophthalmos ENT: no neck masses, no cervical lymphadenopathy Cardiovascular: RRR, No MRG Respiratory: CTA B Musculoskeletal: no deformities Skin:no rashes Neurological: no tremor with outstretched hands  Assessment: 1.  Clinical osteoporosis  2.  Vitamin D insufficiency  Plan: 1.  Clinical osteoporosis -Likely postmenopausal/age-related, but also possibly due to early TAH-also she had BSO only after she became menopausal.  No family history of osteoporosis. -No falls or fractures since last visit -Latest bone density scan is from 11/2021: T-score within the osteoporotic range (-2.9) at the 33% distal radius and it was also very low, at -3.7 at the ultra distal radius, which is a surrogate marker for trabecular bone.  We do not have previous ultra distal radius scores to compare.  She will be due for another bone density scan this summer.  Per her preference, we will order this at next visit. -Due to her history of GERD, she was hesitant to take p.o. medications for osteoporosis.  She is on a PPI taken in the morning. -Started Prolia in 09/2019.  She does not miss doses.  Latest injection was 04/24/2023.  She has no thigh or hip pain.  Also, no jaw pain.  She sees her dentist every 6 months. -She is getting approximately 1000 to 1200 mg of calcium daily from the diet.  We discussed that there is no need for further  supplementation. -I previously recommended weightbearing exercises and given her the National osteoporosis foundation recommendations.  She does exercise 3-5 times a week at the gym. -She did not try OsteoStrong skeletal loading (given brochure in the past) -She is not smoking or drinking alcohol.  She is trying to eat a low acid diet. -She is aware about fall precautions.  She is very careful not to fall. -She has a mildly decreased kidney function, but not contraindicating Prolia.  Reviewed latest calcium and kidney function from 02/2023.  She will have another kidney function checked in 2 days in preparation for her hip replacement surgery. -Will continue Prolia for now -Will see her back in 1 year  2.  Vitamin D insufficiency -She is on 2000 units vitamin D daily -At last visit, vitamin D level was normal: 52.83. -We will recheck the level today  Orders Placed This Encounter  Procedures   Vitamin D, 25-hydroxy   Carlus Pavlov, MD PhD [facility] Endocrinology

## 2023-07-02 LAB — VITAMIN D 25 HYDROXY (VIT D DEFICIENCY, FRACTURES): Vit D, 25-Hydroxy: 45 ng/mL (ref 30–100)

## 2023-07-03 NOTE — Patient Instructions (Addendum)
 SURGICAL WAITING ROOM VISITATION Patients having surgery or a procedure may have no more than 2 support people in the waiting area - these visitors may rotate.    Children under the age of 86 must have an adult with them who is not the patient.  Due to an increase in RSV and influenza rates and associated hospitalizations, children ages 86 and under may not visit patients in [facility] hospitals.   If the patient needs to stay at the hospital during part of their recovery, the visitor guidelines for inpatient rooms apply. Pre-op nurse will coordinate an appropriate time for 1 support person to accompany patient in pre-op.  This support person may not rotate.    Please refer to the [facility] website for the visitor guidelines for Inpatients (after your surgery is over and you are in a regular room).       Your procedure is scheduled on: 07-17-23   Report to [facility] Main Entrance    Report to admitting at 7:15 AM   Call this number if you have problems the morning of surgery [phone]   Do not eat food :After Midnight.   After Midnight you may have the following liquids until 6:45 AM DAY OF SURGERY  Water Non-Citrus Juices (without pulp, NO RED-Apple, White grape, White cranberry) Black Coffee (NO MILK/CREAM OR CREAMERS, sugar ok)  Clear Tea (NO MILK/CREAM OR CREAMERS, sugar ok) regular and decaf                             Plain Jell-O (NO RED)                                           Fruit ices (not with fruit pulp, NO RED)                                     Popsicles (NO RED)                                                               Sports drinks like Gatorade (NO RED)                   The day of surgery:  Drink ONE (1) Pre-Surgery Clear Ensure by 6:45 AM the morning of surgery. Drink in one sitting. Do not sip.  This drink was given to you during your hospital  pre-op appointment visit. Nothing else to drink after completing the Pre-Surgery Clear  Ensure.          If you have questions, please contact your surgeon's office.   FOLLOW  ANY ADDITIONAL PRE OP INSTRUCTIONS YOU RECEIVED FROM YOUR SURGEON'S OFFICE!!!     Oral Hygiene is also important to reduce your risk of infection.                                    Remember - BRUSH YOUR TEETH THE MORNING OF SURGERY WITH YOUR REGULAR TOOTHPASTE  Do NOT smoke after Midnight   Take these medicines the morning of surgery with A SIP OF WATER:    Omeprazole   Flonase nasal spray   Okay to use eyedrops  Stop all vitamins and herbal supplements 7 days before surgery                              You may not have any metal on your body including hair pins, jewelry, and body piercing             Do not wear make-up, lotions, powders, perfumes, or deodorant  Do not wear nail polish including gel and S&S, artificial/acrylic nails, or any other type of covering on natural nails including finger and toenails. If you have artificial nails, gel coating, etc. that needs to be removed by a nail salon please have this removed prior to surgery or surgery may need to be canceled/ delayed if the surgeon/ anesthesia feels like they are unable to be safely monitored.   Do not shave  48 hours prior to surgery.    Do not bring valuables to the hospital. [facility] IS NOT RESPONSIBLE   FOR VALUABLES.   Contacts, dentures or bridgework may not be worn into surgery.   Bring small overnight bag day of surgery.   DO NOT BRING YOUR HOME MEDICATIONS TO THE HOSPITAL. PHARMACY WILL DISPENSE MEDICATIONS LISTED ON YOUR MEDICATION LIST TO YOU DURING YOUR ADMISSION IN THE HOSPITAL!   Special Instructions: Bring a copy of your healthcare power of attorney and living will documents the day of surgery if you haven't scanned them before.              Please read over the following fact sheets you were given: IF YOU HAVE QUESTIONS ABOUT YOUR PRE-OP INSTRUCTIONS PLEASE CALL [phone] G[facility]  If you received a  COVID test during your pre-op visit  it is requested that you wear a mask when out in public, stay away from anyone that may not be feeling well and notify your surgeon if you develop symptoms. If you test positive for Covid or have been in contact with anyone that has tested positive in the last 10 days please notify you surgeon.    Pre-operative 5 CHG Bath Instructions   You can play a key role in reducing the risk of infection after surgery. Your skin needs to be as free of germs as possible. You can reduce the number of germs on your skin by washing with CHG (chlorhexidine gluconate) soap before surgery. CHG is an antiseptic soap that kills germs and continues to kill germs even after washing.   DO NOT use if you have an allergy to chlorhexidine/CHG or antibacterial soaps. If your skin becomes reddened or irritated, stop using the CHG and notify one of our RNs at [phone].   Please shower with the CHG soap starting 4 days before surgery using the following schedule:     Please keep in mind the following:  DO NOT shave, including legs and underarms, starting the day of your first shower.   You may shave your face at any point before/day of surgery.  Place clean sheets on your bed the day you start using CHG soap. Use a clean washcloth (not used since being washed) for each shower. DO NOT sleep with pets once you start using the CHG.   CHG Shower Instructions:  If you choose to wash your hair  and private area, wash first with your normal shampoo/soap.  After you use shampoo/soap, rinse your hair and body thoroughly to remove shampoo/soap residue.  Turn the water OFF and apply about 3 tablespoons (45 ml) of CHG soap to a CLEAN washcloth.  Apply CHG soap ONLY FROM YOUR NECK DOWN TO YOUR TOES (washing for 3-5 minutes)  DO NOT use CHG soap on face, private areas, open wounds, or sores.  Pay special attention to the area where your surgery is being performed.  If you are having back  surgery, having someone wash your back for you may be helpful. Wait 2 minutes after CHG soap is applied, then you may rinse off the CHG soap.  Pat dry with a clean towel  Put on clean clothes/pajamas   If you choose to wear lotion, please use ONLY the CHG-compatible lotions on the back of this paper.     Additional instructions for the day of surgery: DO NOT APPLY any lotions, deodorants, cologne, or perfumes.   Put on clean/comfortable clothes.  Brush your teeth.  Ask your nurse before applying any prescription medications to the skin.      CHG Compatible Lotions   Aveeno Moisturizing lotion  Cetaphil Moisturizing Cream  Cetaphil Moisturizing Lotion  Clairol Herbal Essence Moisturizing Lotion, Dry Skin  Clairol Herbal Essence Moisturizing Lotion, Extra Dry Skin  Clairol Herbal Essence Moisturizing Lotion, Normal Skin  Curel Age Defying Therapeutic Moisturizing Lotion with Alpha Hydroxy  Curel Extreme Care Body Lotion  Curel Soothing Hands Moisturizing Hand Lotion  Curel Therapeutic Moisturizing Cream, Fragrance-Free  Curel Therapeutic Moisturizing Lotion, Fragrance-Free  Curel Therapeutic Moisturizing Lotion, Original Formula  Eucerin Daily Replenishing Lotion  Eucerin Dry Skin Therapy Plus Alpha Hydroxy Crme  Eucerin Dry Skin Therapy Plus Alpha Hydroxy Lotion  Eucerin Original Crme  Eucerin Original Lotion  Eucerin Plus Crme Eucerin Plus Lotion  Eucerin TriLipid Replenishing Lotion  Keri Anti-Bacterial Hand Lotion  Keri Deep Conditioning Original Lotion Dry Skin Formula Softly Scented  Keri Deep Conditioning Original Lotion, Fragrance Free Sensitive Skin Formula  Keri Lotion Fast Absorbing Fragrance Free Sensitive Skin Formula  Keri Lotion Fast Absorbing Softly Scented Dry Skin Formula  Keri Original Lotion  Keri Skin Renewal Lotion Keri Silky Smooth Lotion  Keri Silky Smooth Sensitive Skin Lotion  Nivea Body Creamy Conditioning Oil  Nivea Body Extra Enriched  Lotion  Nivea Body Original Lotion  Nivea Body Sheer Moisturizing Lotion Nivea Crme  Nivea Skin Firming Lotion  NutraDerm 30 Skin Lotion  NutraDerm Skin Lotion  NutraDerm Therapeutic Skin Cream  NutraDerm Therapeutic Skin Lotion  ProShield Protective Hand Cream  Provon moisturizing lotion   PATIENT SIGNATURE_________________________________  NURSE SIGNATURE__________________________________  ________________________________________________________________________    Rogelia Mire  An incentive spirometer is a tool that can help keep your lungs clear and active. This tool measures how well you are filling your lungs with each breath. Taking long deep breaths may help reverse or decrease the chance of developing breathing (pulmonary) problems (especially infection) following: A long period of time when you are unable to move or be active. BEFORE THE PROCEDURE  If the spirometer includes an indicator to show your best effort, your nurse or respiratory therapist will set it to a desired goal. If possible, sit up straight or lean slightly forward. Try not to slouch. Hold the incentive spirometer in an upright position. INSTRUCTIONS FOR USE  Sit on the edge of your bed if possible, or sit up as far as you can in bed or on  a chair. Hold the incentive spirometer in an upright position. Breathe out normally. Place the mouthpiece in your mouth and seal your lips tightly around it. Breathe in slowly and as deeply as possible, raising the piston or the ball toward the top of the column. Hold your breath for 3-5 seconds or for as long as possible. Allow the piston or ball to fall to the bottom of the column. Remove the mouthpiece from your mouth and breathe out normally. Rest for a few seconds and repeat Steps 1 through 7 at least 10 times every 1-2 hours when you are awake. Take your time and take a few normal breaths between deep breaths. The spirometer may include an indicator to  show your best effort. Use the indicator as a goal to work toward during each repetition. After each set of 10 deep breaths, practice coughing to be sure your lungs are clear. If you have an incision (the cut made at the time of surgery), support your incision when coughing by placing a pillow or rolled up towels firmly against it. Once you are able to get out of bed, walk around indoors and cough well. You may stop using the incentive spirometer when instructed by your caregiver.  RISKS AND COMPLICATIONS Take your time so you do not get dizzy or light-headed. If you are in pain, you may need to take or ask for pain medication before doing incentive spirometry. It is harder to take a deep breath if you are having pain. AFTER USE Rest and breathe slowly and easily. It can be helpful to keep track of a log of your progress. Your caregiver can provide you with a simple table to help with this. If you are using the spirometer at home, follow these instructions: SEEK MEDICAL CARE IF:  You are having difficultly using the spirometer. You have trouble using the spirometer as often as instructed. Your pain medication is not giving enough relief while using the spirometer. You develop fever of 100.5 F (38.1 C) or higher. SEEK IMMEDIATE MEDICAL CARE IF:  You cough up bloody sputum that had not been present before. You develop fever of 102 F (38.9 C) or greater. You develop worsening pain at or near the incision site. MAKE SURE YOU:  Understand these instructions. Will watch your condition. Will get help right away if you are not doing well or get worse. Document Released: 09/10/2006 Document Revised: 07/23/2011 Document Reviewed: 11/11/2006 Exit[employer] Patient Information 2014 Exit[employer], [place].   ________________________________________________________________________ WHAT IS A BLOOD TRANSFUSION? Blood Transfusion Information  A transfusion is the replacement of blood or some of its parts. Blood  is made up of multiple cells which provide different functions. Red blood cells carry oxygen and are used for blood loss replacement. White blood cells fight against infection. Platelets control bleeding. Plasma helps clot blood. Other blood products are available for specialized needs, such as hemophilia or other clotting disorders. BEFORE THE TRANSFUSION  Who gives blood for transfusions?  Healthy volunteers who are fully evaluated to make sure their blood is safe. This is blood bank blood. Transfusion therapy is the safest it has ever been in the practice of medicine. Before blood is taken from a donor, a complete history is taken to make sure that person has no history of diseases nor engages in risky social behavior (examples are intravenous drug use or sexual activity with multiple partners). The donor's travel history is screened to minimize risk of transmitting infections, such as malaria. The donated blood is  tested for signs of infectious diseases, such as HIV and hepatitis. The blood is then tested to be sure it is compatible with you in order to minimize the chance of a transfusion reaction. If you or a relative donates blood, this is often done in anticipation of surgery and is not appropriate for emergency situations. It takes many days to process the donated blood. RISKS AND COMPLICATIONS Although transfusion therapy is very safe and saves many lives, the main dangers of transfusion include:  Getting an infectious disease. Developing a transfusion reaction. This is an allergic reaction to something in the blood you were given. Every precaution is taken to prevent this. The decision to have a blood transfusion has been considered carefully by your caregiver before blood is given. Blood is not given unless the benefits outweigh the risks. AFTER THE TRANSFUSION Right after receiving a blood transfusion, you will usually feel much better and more energetic. This is especially true if your  red blood cells have gotten low (anemic). The transfusion raises the level of the red blood cells which carry oxygen, and this usually causes an energy increase. The nurse administering the transfusion will monitor you carefully for complications. HOME CARE INSTRUCTIONS  No special instructions are needed after a transfusion. You may find your energy is better. Speak with your caregiver about any limitations on activity for underlying diseases you may have. SEEK MEDICAL CARE IF:  Your condition is not improving after your transfusion. You develop redness or irritation at the intravenous (IV) site. SEEK IMMEDIATE MEDICAL CARE IF:  Any of the following symptoms occur over the next 12 hours: Shaking chills. You have a temperature by mouth above 102 F (38.9 C), not controlled by medicine. Chest, back, or muscle pain. People around you feel you are not acting correctly or are confused. Shortness of breath or difficulty breathing. Dizziness and fainting. You get a rash or develop hives. You have a decrease in urine output. Your urine turns a dark color or changes to pink, red, or brown. Any of the following symptoms occur over the next 10 days: You have a temperature by mouth above 102 F (38.9 C), not controlled by medicine. Shortness of breath. Weakness after normal activity. The white part of the eye turns yellow (jaundice). You have a decrease in the amount of urine or are urinating less often. Your urine turns a dark color or changes to pink, red, or brown. Document Released: 04/27/2000 Document Revised: 07/23/2011 Document Reviewed: 12/15/2007 [facility] Patient Information 2014 [place], [place].  _______________________________________________________________________

## 2023-07-04 NOTE — H&P (Signed)
TOTAL HIP ADMISSION H&P  Patient is admitted for left total hip arthroplasty.  Subjective:  Chief Complaint: Left hip pain  HPI: Desiree Caldwell, 89 y.o. female, has a history of pain and functional disability in the left hip due to arthritis and patient has failed non-surgical conservative treatments for greater than 12 weeks to include use of assistive devices and activity modification. Onset of symptoms was gradual, starting  several  years ago with gradually worsening course since that time. The patient noted no past surgery on the left hip. Patient currently rates pain in the left hip at 8 out of 10 with activity. Patient has night pain, worsening of pain with activity and weight bearing, pain that interfers with activities of daily living, and pain with passive range of motion. Patient has evidence of  bone-on-bone arthritis in the left hip with osteophyte formation and some subchondral cystic formation  by imaging studies. This condition presents safety issues increasing the risk of falls. There is no current active infection.  Patient Active Problem List   Diagnosis Date Noted   Osteopenia of left hip 06/24/2019   Clinical osteoporosis without current pathological fracture 06/24/2019   Essential hypertension 07/02/2014   GERD (gastroesophageal reflux disease) 07/02/2014   Hyperlipidemia 07/02/2014   Allergic rhinitis 07/02/2014   OA (osteoarthritis) of hip 06/21/2014    Past Medical History:  Diagnosis Date   Arthritis    Complication of anesthesia    SLOW TO WAKE UP   Difficulty sleeping    DUE TO PAIN   Diverticulosis    GERD (gastroesophageal reflux disease)    Hyperlipidemia    Hypertension    Osteopenia    Seasonal allergies     Past Surgical History:  Procedure Laterality Date   ABDOMINAL HYSTERECTOMY     > 20 YRS AGO    BILATERAL OOPHORECTOMY  1991   BREAST BIOPSY Right 11/24/2014    benign   TOTAL HIP ARTHROPLASTY Right 06/21/2014   Procedure: RIGHT TOTAL  HIP ARTHROPLASTY ANTERIOR APPROACH;  Surgeon: Loanne Drilling, MD;  Location: [facility]S;  Service: Orthopedics;  Laterality: Right;    Prior to Admission medications   Medication Sig Start Date End Date Taking? Authorizing Provider  aspirin EC 81 MG tablet Take 81 mg by mouth daily.   Yes [provider]  docusate sodium (COLACE) 100 MG capsule Take 100 mg by mouth at bedtime.   Yes [provider]  fluticasone (FLONASE) 50 MCG/ACT nasal spray Place 2 sprays into both nostrils every morning.   Yes [provider]  Glycerin-Hypromellose-PEG 400 (DRY EYE RELIEF DROPS) 0.2-0.2-1 % SOLN Place 1 drop into both eyes daily.   Yes [provider]  hydrochlorothiazide (HYDRODIURIL) 25 MG tablet Take 12.5 mg by mouth every morning.   Yes [provider]  omeprazole (PRILOSEC) 20 MG capsule Take 20 mg by mouth every morning.   Yes [provider]  simvastatin (ZOCOR) 80 MG tablet Take 80 mg by mouth at bedtime.   Yes [provider]    No Known Allergies  Social History   Socioeconomic History   Marital status: Divorced    Spouse name: Not on file   Number of children: Not on file   Years of education: Not on file   Highest education level: Not on file  Occupational History   Not on file  Tobacco Use   Smoking status: Never   Smokeless tobacco: Never  Substance and Sexual Activity   Alcohol use:  Yes    Comment: RARE   Drug use: No   Sexual activity: Not on file  Other Topics Concern   Not on file  Social History Narrative   Not on file   Social Drivers of Health   Financial Resource Strain: Not on file  Food Insecurity: Not on file  Transportation Needs: Not on file  Physical Activity: Not on file  Stress: Not on file  Social Connections: Not on file  Intimate Partner Violence: Not on file    Tobacco Use: Low Risk  (07/01/2023)   Patient History    Smoking Tobacco Use: Never    Smokeless Tobacco Use: Never     Passive Exposure: Not on file   Social History   Substance and Sexual Activity  Alcohol Use Yes   Comment: RARE    Family History  Problem Relation Age of Onset   Breast cancer Neg Hx    BRCA 1/2 Neg Hx     Review of Systems  Constitutional:  Negative for chills and fever.  HENT:  Negative for congestion, sore throat and tinnitus.   Eyes:  Negative for double vision, photophobia and pain.  Respiratory:  Negative for cough, shortness of breath and wheezing.   Cardiovascular:  Negative for chest pain, palpitations and orthopnea.  Gastrointestinal:  Negative for heartburn, nausea and vomiting.  Genitourinary:  Negative for dysuria, frequency and urgency.  Musculoskeletal:  Positive for joint pain.  Neurological:  Negative for dizziness, weakness and headaches.     Objective:  Physical Exam: Well nourished and well developed.  General: Alert and oriented x3, cooperative and pleasant, no acute distress.  Head: normocephalic, atraumatic, neck supple.  Eyes: EOMI.  Musculoskeletal:  Left hip can be flexed to 100 degrees with minimal internal rotation, about 20 degrees of external rotation, and 25 degrees of abduction.  - Walking with a cane with an antalgic gait pattern on the left.  Calves soft and nontender. Motor function intact in LE. Strength 5/5 LE bilaterally. Neuro: Distal pulses 2+. Sensation to light touch intact in LE.   Imaging Review Plain radiographs demonstrate severe degenerative joint disease of the left hip. The bone quality appears to be adequate for age and reported activity level.  Assessment/Plan:  End stage arthritis, left hip  The patient history, physical examination, clinical judgement of the provider and imaging studies are consistent with end stage degenerative joint disease of the left hip and total hip arthroplasty is deemed medically necessary. The treatment options including medical management, injection therapy, arthroscopy and arthroplasty  were discussed at length. The risks and benefits of total hip arthroplasty were presented and reviewed. The risks due to aseptic loosening, infection, stiffness, dislocation/subluxation, thromboembolic complications and other imponderables were discussed. The patient acknowledged the explanation, agreed to proceed with the plan and consent was signed. Patient is being admitted for inpatient treatment for surgery, pain control, PT, OT, prophylactic antibiotics, VTE prophylaxis, progressive ambulation and ADLs and discharge planning.The patient is planning to be discharged  home .   Patient's anticipated LOS is less than 2 midnights, meeting these requirements: - Lives within 1 hour of care - Has a competent adult at home to recover with post-op recover - NO history of  - Chronic pain requiring opioids  - Diabetes  - Coronary Artery Disease  - Heart failure  - Heart attack  - Stroke  - DVT/VTE  - Cardiac arrhythmia  - Respiratory Failure/COPD  - Renal failure  - Anemia  - Advanced Liver  disease  Therapy Plans: HEP Disposition: Home with daughter Planned DVT Prophylaxis: Aspirin 81 mg BID DME Needed: None PCP: Lupita Raider, MD (clearance received) TXA: IV Allergies: NKDA Anesthesia Concerns: None BMI: 24.4 Last HgbA1c: Not diabetic  Pain Regimen: Hydrocodone, tramadol Pharmacy: [facility] (have brought to room)  Other: - Wants to stay two nights  - Patient was instructed on what medications to stop prior to surgery. - Follow-up visit in 2 weeks with Dr. Lequita Halt - Begin physical therapy following surgery - Pre-operative lab work as pre-surgical testing - Prescriptions will be provided in hospital at time of discharge  Arther Abbott, PA-C Orthopedic Surgery [facility] [place]

## 2023-07-09 ENCOUNTER — Other Ambulatory Visit: Payer: Self-pay

## 2023-07-09 ENCOUNTER — Encounter (HOSPITAL_COMMUNITY)
Admission: RE | Admit: 2023-07-09 | Discharge: 2023-07-09 | Disposition: A | Discharge status: Home / Self Care | Payer: Medicare PPO | Source: Ambulatory Visit | Attending: Orthopedic Surgery | Admitting: Orthopedic Surgery

## 2023-07-09 ENCOUNTER — Encounter (HOSPITAL_COMMUNITY): Payer: Self-pay

## 2023-07-09 VITALS — BP 154/82 | HR 70 | Temp 97.9°F | Resp 16 | Ht 63.0 in | Wt 138.6 lb

## 2023-07-09 DIAGNOSIS — I10 Essential (primary) hypertension: Secondary | ICD-10-CM | POA: Insufficient documentation

## 2023-07-09 DIAGNOSIS — Z01818 Encounter for other preprocedural examination: Secondary | ICD-10-CM | POA: Insufficient documentation

## 2023-07-09 DIAGNOSIS — I444 Left anterior fascicular block: Secondary | ICD-10-CM | POA: Insufficient documentation

## 2023-07-09 HISTORY — DX: Unspecified malignant neoplasm of skin, unspecified: C44.90

## 2023-07-09 LAB — BASIC METABOLIC PANEL
Anion gap: 10 (ref 5–15)
BUN: 20 mg/dL (ref 8–23)
CO2: 26 mmol/L (ref 22–32)
Calcium: 9.3 mg/dL (ref 8.9–10.3)
Chloride: 103 mmol/L (ref 98–111)
Creatinine, Ser: 0.97 mg/dL (ref 0.44–1.00)
GFR, Estimated: 57 mL/min — ABNORMAL LOW (ref >60)
Glucose, Bld: 88 mg/dL (ref 70–99)
Potassium: 3.2 mmol/L — ABNORMAL LOW (ref 3.5–5.1)
Sodium: 139 mmol/L (ref 135–145)

## 2023-07-09 LAB — CBC
HCT: 43.7 % (ref 36.0–46.0)
Hemoglobin: 14.1 g/dL (ref 12.0–15.0)
MCH: 29.7 pg (ref 26.0–34.0)
MCHC: 32.3 g/dL (ref 30.0–36.0)
MCV: 92 fL (ref 80.0–100.0)
Platelets: 266 10*3/uL (ref 150–400)
RBC: 4.75 MIL/uL (ref 3.87–5.11)
RDW: 13.9 % (ref 11.5–15.5)
WBC: 7.9 10*3/uL (ref 4.0–10.5)
nRBC: 0 % (ref 0.0–0.2)

## 2023-07-09 LAB — SURGICAL PCR SCREEN
MRSA, PCR: NEGATIVE
Staphylococcus aureus: NEGATIVE

## 2023-07-09 NOTE — Progress Notes (Signed)
 COVID Vaccine Completed:  Date of COVID positive in last 90 days:  No  PCP - Lupita Raider, MD Cardiologist - N/A  Chest x-ray - N/A EKG - 07-09-23 Epic Stress Test - N/A ECHO - N/A Cardiac Cath - N/A Pacemaker/ICD device last checked: Spinal Cord Stimulator:N/A  Bowel Prep - N/A  Sleep Study - N/A CPAP -   Fasting Blood Sugar - N/A Checks Blood Sugar _____ times a day  Last dose of GLP1 agonist-  N/A GLP1 instructions:  Hold 7 days before surgery    Last dose of SGLT-2 inhibitors-  N/A SGLT-2 instructions:  Hold 3 days before surgery   Blood Thinner Instructions:  Last dose:   Time: Aspirin Instructions:  ASA 81.  Hold x5 days per patient Last Dose:  Activity level:  Can go up a flight of stairs and perform activities of daily living without stopping and without symptoms of chest pain or shortness of breath.  Anesthesia review: N/A  Patient denies shortness of breath, fever, cough and chest pain at PAT appointment  Patient verbalized understanding of instructions that were given to them at the PAT appointment. Patient was also instructed that they will need to review over the PAT instructions again at home before surgery.

## 2023-07-16 ENCOUNTER — Encounter (HOSPITAL_COMMUNITY): Payer: Self-pay | Admitting: Orthopedic Surgery

## 2023-07-16 NOTE — Anesthesia Preprocedure Evaluation (Signed)
 Anesthesia Evaluation  Patient identified by MRN, date of birth, ID band Patient awake    Reviewed: Allergy & Precautions, NPO status , Patient's Chart, lab work & pertinent test results  History of Anesthesia Complications (+) history of anesthetic complications  Airway Mallampati: II  TM Distance: >3 FB     Dental no notable dental hx. (+) Dental Advisory Given, Teeth Intact, Chipped,    Pulmonary neg pulmonary ROS   Pulmonary exam normal breath sounds clear to auscultation       Cardiovascular hypertension, Pt. on medications Normal cardiovascular exam Rhythm:Regular Rate:Normal     Neuro/Psych negative neurological ROS  negative psych ROS   GI/Hepatic Neg liver ROS,GERD  Medicated,,  Endo/Other  HLD  Renal/GU negative Renal ROS  negative genitourinary   Musculoskeletal  (+) Arthritis , Osteoarthritis,    Abdominal   Peds  Hematology negative hematology ROS (+)   Anesthesia Other Findings   Reproductive/Obstetrics                             Anesthesia Physical Anesthesia Plan  ASA: 2  Anesthesia Plan: Spinal   Post-op Pain Management: Minimal or no pain anticipated   Induction:   PONV Risk Score and Plan: 3 and Treatment may vary due to age or medical condition and Propofol infusion  Airway Management Planned: Natural Airway and Simple Face Mask  Additional Equipment: None  Intra-op Plan:   Post-operative Plan:   Informed Consent: I have reviewed the patients History and Physical, chart, labs and discussed the procedure including the risks, benefits and alternatives for the proposed anesthesia with the patient or authorized representative who has indicated his/her understanding and acceptance.     Dental advisory given  Plan Discussed with: CRNA and Anesthesiologist  Anesthesia Plan Comments:         Anesthesia Quick Evaluation

## 2023-07-17 ENCOUNTER — Other Ambulatory Visit: Payer: Self-pay

## 2023-07-17 ENCOUNTER — Ambulatory Visit (HOSPITAL_COMMUNITY): Payer: Self-pay | Admitting: Anesthesiology

## 2023-07-17 ENCOUNTER — Ambulatory Visit (HOSPITAL_COMMUNITY)

## 2023-07-17 ENCOUNTER — Observation Stay (HOSPITAL_COMMUNITY)
Admission: RE | Admit: 2023-07-17 | Discharge: 2023-07-19 | Disposition: A | Discharge status: Home / Self Care | Payer: Medicare PPO | Source: Ambulatory Visit | Attending: Orthopedic Surgery | Admitting: Orthopedic Surgery

## 2023-07-17 ENCOUNTER — Encounter (HOSPITAL_COMMUNITY)
Admission: RE | Disposition: A | Discharge status: Home / Self Care | Payer: Self-pay | Source: Ambulatory Visit | Attending: Orthopedic Surgery

## 2023-07-17 ENCOUNTER — Observation Stay (HOSPITAL_COMMUNITY)

## 2023-07-17 ENCOUNTER — Encounter (HOSPITAL_COMMUNITY): Payer: Self-pay | Admitting: Orthopedic Surgery

## 2023-07-17 DIAGNOSIS — Z79899 Other long term (current) drug therapy: Secondary | ICD-10-CM | POA: Insufficient documentation

## 2023-07-17 DIAGNOSIS — Z96643 Presence of artificial hip joint, bilateral: Secondary | ICD-10-CM | POA: Diagnosis not present

## 2023-07-17 DIAGNOSIS — Z96641 Presence of right artificial hip joint: Secondary | ICD-10-CM | POA: Diagnosis not present

## 2023-07-17 DIAGNOSIS — Z7982 Long term (current) use of aspirin: Secondary | ICD-10-CM | POA: Diagnosis not present

## 2023-07-17 DIAGNOSIS — M1612 Unilateral primary osteoarthritis, left hip: Principal | ICD-10-CM | POA: Diagnosis present

## 2023-07-17 DIAGNOSIS — I10 Essential (primary) hypertension: Secondary | ICD-10-CM | POA: Diagnosis not present

## 2023-07-17 DIAGNOSIS — M169 Osteoarthritis of hip, unspecified: Principal | ICD-10-CM | POA: Diagnosis present

## 2023-07-17 DIAGNOSIS — Z85828 Personal history of other malignant neoplasm of skin: Secondary | ICD-10-CM | POA: Diagnosis not present

## 2023-07-17 DIAGNOSIS — Z471 Aftercare following joint replacement surgery: Secondary | ICD-10-CM | POA: Diagnosis not present

## 2023-07-17 HISTORY — PX: TOTAL HIP ARTHROPLASTY: SHX124

## 2023-07-17 LAB — TYPE AND SCREEN
ABO/RH(D): O POS
Antibody Screen: NEGATIVE

## 2023-07-17 SURGERY — ARTHROPLASTY, HIP, TOTAL, ANTERIOR APPROACH
Anesthesia: Spinal | Site: Hip | Laterality: Left

## 2023-07-17 MED ORDER — PHENYLEPHRINE HCL-NACL 20-0.9 MG/250ML-% IV SOLN
INTRAVENOUS | Status: DC | PRN
  Administered 2023-07-17: 25 ug/min via INTRAVENOUS

## 2023-07-17 MED ORDER — CHLORHEXIDINE GLUCONATE 0.12 % MT SOLN
15.0000 mL | Freq: Once | OROMUCOSAL | Status: AC
  Administered 2023-07-17: 15 mL via OROMUCOSAL

## 2023-07-17 MED ORDER — ATORVASTATIN CALCIUM 40 MG PO TABS
40.0000 mg | ORAL_TABLET | Freq: Every day | ORAL | Status: DC
  Administered 2023-07-18 – 2023-07-19 (×2): 40 mg via ORAL
  Filled 2023-07-17 (×2): qty 1

## 2023-07-17 MED ORDER — ASPIRIN 81 MG PO CHEW
81.0000 mg | CHEWABLE_TABLET | Freq: Two times a day (BID) | ORAL | Status: DC
  Administered 2023-07-18 – 2023-07-19 (×3): 81 mg via ORAL
  Filled 2023-07-17 (×3): qty 1

## 2023-07-17 MED ORDER — ONDANSETRON HCL 4 MG/2ML IJ SOLN: 4.0000 mg | Freq: Once | INTRAMUSCULAR | Status: DC | PRN

## 2023-07-17 MED ORDER — LIDOCAINE HCL (CARDIAC) PF 100 MG/5ML IV SOSY
PREFILLED_SYRINGE | INTRAVENOUS | Status: DC | PRN
  Administered 2023-07-17: 60 mg via INTRATRACHEAL

## 2023-07-17 MED ORDER — WATER FOR IRRIGATION, STERILE IR SOLN
Status: DC | PRN
  Administered 2023-07-17: 1000 mL

## 2023-07-17 MED ORDER — MENTHOL 3 MG MT LOZG: 1.0000 | LOZENGE | OROMUCOSAL | Status: DC | PRN

## 2023-07-17 MED ORDER — PROPOFOL 500 MG/50ML IV EMUL
INTRAVENOUS | Status: DC | PRN
  Administered 2023-07-17: 75 ug/kg/min via INTRAVENOUS

## 2023-07-17 MED ORDER — DEXAMETHASONE SODIUM PHOSPHATE 10 MG/ML IJ SOLN
INTRAMUSCULAR | Status: DC | PRN
  Administered 2023-07-17: 5 mg via INTRAVENOUS

## 2023-07-17 MED ORDER — ONDANSETRON HCL 4 MG/2ML IJ SOLN: 4.0000 mg | Freq: Four times a day (QID) | INTRAMUSCULAR | Status: DC | PRN

## 2023-07-17 MED ORDER — SODIUM CHLORIDE 0.9 % IV SOLN: INTRAVENOUS | Status: DC | PRN

## 2023-07-17 MED ORDER — METHOCARBAMOL 500 MG PO TABS
500.0000 mg | ORAL_TABLET | Freq: Four times a day (QID) | ORAL | Status: DC | PRN
  Administered 2023-07-17 – 2023-07-18 (×2): 500 mg via ORAL
  Filled 2023-07-17 (×2): qty 1

## 2023-07-17 MED ORDER — LACTATED RINGERS IV SOLN: INTRAVENOUS | Status: DC | PRN

## 2023-07-17 MED ORDER — 0.9 % SODIUM CHLORIDE (POUR BTL) OPTIME
TOPICAL | Status: DC | PRN
  Administered 2023-07-17: 1000 mL

## 2023-07-17 MED ORDER — OXYCODONE HCL 5 MG/5ML PO SOLN: 5.0000 mg | Freq: Once | ORAL | Status: DC | PRN

## 2023-07-17 MED ORDER — PANTOPRAZOLE SODIUM 40 MG PO TBEC
40.0000 mg | DELAYED_RELEASE_TABLET | Freq: Every day | ORAL | Status: DC
  Administered 2023-07-18 – 2023-07-19 (×2): 40 mg via ORAL
  Filled 2023-07-17 (×2): qty 1

## 2023-07-17 MED ORDER — POLYETHYLENE GLYCOL 3350 17 G PO PACK: 17.0000 g | PACK | Freq: Every day | ORAL | Status: DC | PRN

## 2023-07-17 MED ORDER — ACETAMINOPHEN 325 MG PO TABS
325.0000 mg | ORAL_TABLET | Freq: Four times a day (QID) | ORAL | Status: DC | PRN
  Administered 2023-07-18: 650 mg via ORAL
  Administered 2023-07-19: 325 mg via ORAL
  Filled 2023-07-17 (×2): qty 2

## 2023-07-17 MED ORDER — ORAL CARE MOUTH RINSE: 15.0000 mL | Freq: Once | OROMUCOSAL | Status: AC

## 2023-07-17 MED ORDER — BUPIVACAINE-EPINEPHRINE (PF) 0.25% -1:200000 IJ SOLN
INTRAMUSCULAR | Status: DC | PRN
  Administered 2023-07-17: 30 mL

## 2023-07-17 MED ORDER — METOCLOPRAMIDE HCL 5 MG PO TABS: 5.0000 mg | ORAL_TABLET | Freq: Three times a day (TID) | ORAL | Status: DC | PRN

## 2023-07-17 MED ORDER — ACETAMINOPHEN 10 MG/ML IV SOLN
1000.0000 mg | INTRAVENOUS | Status: AC
  Administered 2023-07-17: 1000 mg via INTRAVENOUS
  Filled 2023-07-17: qty 100

## 2023-07-17 MED ORDER — HYDROCODONE-ACETAMINOPHEN 5-325 MG PO TABS: 1.0000 | ORAL_TABLET | ORAL | Status: DC | PRN

## 2023-07-17 MED ORDER — DOCUSATE SODIUM 100 MG PO CAPS
100.0000 mg | ORAL_CAPSULE | Freq: Two times a day (BID) | ORAL | Status: DC
  Administered 2023-07-17 – 2023-07-19 (×4): 100 mg via ORAL
  Filled 2023-07-17 (×4): qty 1

## 2023-07-17 MED ORDER — HYDROMORPHONE HCL 1 MG/ML IJ SOLN: 0.2500 mg | INTRAMUSCULAR | Status: DC | PRN

## 2023-07-17 MED ORDER — DEXAMETHASONE SODIUM PHOSPHATE 10 MG/ML IJ SOLN
10.0000 mg | Freq: Once | INTRAMUSCULAR | Status: AC
  Administered 2023-07-18: 10 mg via INTRAVENOUS
  Filled 2023-07-17: qty 1

## 2023-07-17 MED ORDER — BISACODYL 10 MG RE SUPP: 10.0000 mg | Freq: Every day | RECTAL | Status: DC | PRN

## 2023-07-17 MED ORDER — TRAMADOL HCL 50 MG PO TABS
50.0000 mg | ORAL_TABLET | Freq: Four times a day (QID) | ORAL | Status: DC | PRN
  Administered 2023-07-17 – 2023-07-18 (×2): 50 mg via ORAL
  Filled 2023-07-17 (×2): qty 1

## 2023-07-17 MED ORDER — METHOCARBAMOL 1000 MG/10ML IJ SOLN: 500.0000 mg | Freq: Four times a day (QID) | INTRAMUSCULAR | Status: DC | PRN

## 2023-07-17 MED ORDER — FLEET ENEMA RE ENEM: 1.0000 | ENEMA | Freq: Once | RECTAL | Status: DC | PRN

## 2023-07-17 MED ORDER — LACTATED RINGERS IV SOLN: INTRAVENOUS | Status: DC

## 2023-07-17 MED ORDER — HYDROCHLOROTHIAZIDE 12.5 MG PO TABS
12.5000 mg | ORAL_TABLET | Freq: Every morning | ORAL | Status: DC
Start: 2023-07-18 — End: 2023-07-19
  Administered 2023-07-19: 12.5 mg via ORAL
  Filled 2023-07-17 (×2): qty 1

## 2023-07-17 MED ORDER — ONDANSETRON HCL 4 MG/2ML IJ SOLN
INTRAMUSCULAR | Status: DC | PRN
  Administered 2023-07-17: 4 mg via INTRAVENOUS

## 2023-07-17 MED ORDER — LIDOCAINE HCL (PF) 2 % IJ SOLN
INTRAMUSCULAR | Status: AC
  Filled 2023-07-17: qty 5

## 2023-07-17 MED ORDER — METOCLOPRAMIDE HCL 5 MG/ML IJ SOLN: 5.0000 mg | Freq: Three times a day (TID) | INTRAMUSCULAR | Status: DC | PRN

## 2023-07-17 MED ORDER — DEXAMETHASONE SODIUM PHOSPHATE 10 MG/ML IJ SOLN: 8.0000 mg | Freq: Once | INTRAMUSCULAR | Status: DC

## 2023-07-17 MED ORDER — TRANEXAMIC ACID-NACL 1000-0.7 MG/100ML-% IV SOLN
1000.0000 mg | INTRAVENOUS | Status: AC
Start: 2023-07-17 — End: 2023-07-17
  Administered 2023-07-17: 1000 mg via INTRAVENOUS
  Filled 2023-07-17: qty 100

## 2023-07-17 MED ORDER — PHENOL 1.4 % MT LIQD: 1.0000 | OROMUCOSAL | Status: DC | PRN

## 2023-07-17 MED ORDER — BUPIVACAINE-EPINEPHRINE (PF) 0.25% -1:200000 IJ SOLN
INTRAMUSCULAR | Status: AC
  Filled 2023-07-17: qty 30

## 2023-07-17 MED ORDER — ONDANSETRON HCL 4 MG PO TABS: 4.0000 mg | ORAL_TABLET | Freq: Four times a day (QID) | ORAL | Status: DC | PRN

## 2023-07-17 MED ORDER — DEXAMETHASONE SODIUM PHOSPHATE 10 MG/ML IJ SOLN
INTRAMUSCULAR | Status: AC
  Filled 2023-07-17: qty 1

## 2023-07-17 MED ORDER — EPHEDRINE SULFATE (PRESSORS) 50 MG/ML IJ SOLN
INTRAMUSCULAR | Status: DC | PRN
  Administered 2023-07-17: 10 mg via INTRAVENOUS

## 2023-07-17 MED ORDER — MORPHINE SULFATE (PF) 2 MG/ML IV SOLN: 0.5000 mg | INTRAVENOUS | Status: DC | PRN

## 2023-07-17 MED ORDER — ONDANSETRON HCL 4 MG/2ML IJ SOLN
INTRAMUSCULAR | Status: AC
  Filled 2023-07-17: qty 2

## 2023-07-17 MED ORDER — CEFAZOLIN SODIUM-DEXTROSE 2-4 GM/100ML-% IV SOLN
2.0000 g | INTRAVENOUS | Status: AC
Start: 2023-07-17 — End: 2023-07-17
  Administered 2023-07-17: 2 g via INTRAVENOUS
  Filled 2023-07-17: qty 100

## 2023-07-17 MED ORDER — BUPIVACAINE IN DEXTROSE 0.75-8.25 % IT SOLN
INTRATHECAL | Status: DC | PRN
  Administered 2023-07-17: 1.7 mL via INTRATHECAL

## 2023-07-17 MED ORDER — FLUTICASONE PROPIONATE 50 MCG/ACT NA SUSP
2.0000 | Freq: Every morning | NASAL | Status: DC
  Administered 2023-07-18 – 2023-07-19 (×2): 2 via NASAL
  Filled 2023-07-17: qty 16

## 2023-07-17 MED ORDER — SODIUM CHLORIDE 0.9 % IV SOLN: INTRAVENOUS | Status: DC

## 2023-07-17 MED ORDER — POVIDONE-IODINE 10 % EX SWAB: 2.0000 | Freq: Once | CUTANEOUS | Status: DC

## 2023-07-17 MED ORDER — DIPHENHYDRAMINE HCL 12.5 MG/5ML PO ELIX: 12.5000 mg | ORAL_SOLUTION | ORAL | Status: DC | PRN

## 2023-07-17 MED ORDER — OXYCODONE HCL 5 MG PO TABS: 5.0000 mg | ORAL_TABLET | Freq: Once | ORAL | Status: DC | PRN

## 2023-07-17 SURGICAL SUPPLY — 38 items
BAG COUNTER SPONGE SURGICOUNT (BAG) IMPLANT
BAG ZIPLOCK 12X15 (MISCELLANEOUS) IMPLANT
BLADE SAG 18X100X1.27 (BLADE) ×1 IMPLANT
COVER PERINEAL POST (MISCELLANEOUS) ×1 IMPLANT
COVER SURGICAL LIGHT HANDLE (MISCELLANEOUS) ×1 IMPLANT
CUP ACET PINNACLE SECTR 50MM (Hips) IMPLANT
DERMABOND ADVANCED .7 DNX12 (GAUZE/BANDAGES/DRESSINGS) ×1 IMPLANT
DRAPE FOOT SWITCH (DRAPES) ×1 IMPLANT
DRAPE STERI IOBAN 125X83 (DRAPES) ×1 IMPLANT
DRAPE U-SHAPE 47X51 STRL (DRAPES) ×2 IMPLANT
DRSG AQUACEL AG ADV 3.5X10 (GAUZE/BANDAGES/DRESSINGS) ×1 IMPLANT
DURAPREP 26ML APPLICATOR (WOUND CARE) ×1 IMPLANT
ELECT REM PT RETURN 15FT ADLT (MISCELLANEOUS) ×1 IMPLANT
GLOVE BIO SURGEON STRL SZ 6.5 (GLOVE) IMPLANT
GLOVE BIO SURGEON STRL SZ7 (GLOVE) IMPLANT
GLOVE BIO SURGEON STRL SZ8 (GLOVE) ×1 IMPLANT
GLOVE BIOGEL PI IND STRL 7.0 (GLOVE) IMPLANT
GLOVE BIOGEL PI IND STRL 8 (GLOVE) ×1 IMPLANT
GOWN STRL REUS W/ TWL LRG LVL3 (GOWN DISPOSABLE) ×2 IMPLANT
HEAD FEM STD 32X+1 STRL (Hips) IMPLANT
HOLDER FOLEY CATH W/STRAP (MISCELLANEOUS) ×1 IMPLANT
KIT TURNOVER KIT A (KITS) IMPLANT
LINER ACET PNNCL PLUS4 NEUTRAL (Hips) IMPLANT
MANIFOLD NEPTUNE II (INSTRUMENTS) ×1 IMPLANT
PACK ANTERIOR HIP CUSTOM (KITS) ×1 IMPLANT
PENCIL SMOKE EVACUATOR COATED (MISCELLANEOUS) ×1 IMPLANT
PINNACLE PLUS 4 NEUTRAL (Hips) ×1 IMPLANT
PINNACLE SECTOR CUP 50MM (Hips) ×1 IMPLANT
SPIKE FLUID TRANSFER (MISCELLANEOUS) ×1 IMPLANT
STEM FEMORAL SZ 6MM STD ACTIS (Stem) IMPLANT
SUT ETHIBOND NAB CT1 #1 30IN (SUTURE) ×1 IMPLANT
SUT MNCRL AB 4-0 PS2 18 (SUTURE) ×1 IMPLANT
SUT STRATAFIX 0 PDS 27 VIOLET (SUTURE) ×1 IMPLANT
SUT VIC AB 2-0 CT1 TAPERPNT 27 (SUTURE) ×2 IMPLANT
SUTURE STRATFX 0 PDS 27 VIOLET (SUTURE) ×1 IMPLANT
TOWEL GREEN STERILE FF (TOWEL DISPOSABLE) ×1 IMPLANT
TRAY FOLEY MTR SLVR 16FR STAT (SET/KITS/TRAYS/PACK) ×1 IMPLANT
TUBE SUCTION HIGH CAP CLEAR NV (SUCTIONS) ×1 IMPLANT

## 2023-07-17 NOTE — Anesthesia Procedure Notes (Signed)
 Spinal  Patient location during procedure: OR Start time: 07/17/2023 9:44 AM End time: 07/17/2023 12:47 AM Reason for block: surgical anesthesia Staffing Performed: anesthesiologist  Anesthesiologist: Mal Amabile, MD Performed by: Mal Amabile, MD Authorized by: Mal Amabile, MD   Preanesthetic Checklist Completed: patient identified, IV checked, site marked, risks and benefits discussed, surgical consent, monitors and equipment checked, pre-op evaluation and timeout performed Spinal Block Patient position: sitting Prep: DuraPrep and site prepped and draped Patient monitoring: heart rate, cardiac monitor, continuous pulse ox and blood pressure Approach: midline Location: L3-4 Injection technique: single-shot Needle Needle type: Pencan  Needle gauge: 24 G Needle length: 9 cm Needle insertion depth: 6 cm Assessment Sensory level: T6 Events: CSF return Additional Notes Patient tolerated procedure well. Adequate sensory level.

## 2023-07-17 NOTE — Interval H&P Note (Signed)
 History and Physical Interval Note:  07/17/2023 7:19 AM  Desiree Caldwell  has presented today for surgery, with the diagnosis of left hip osteoarthritis.  The various methods of treatment have been discussed with the patient and family. After consideration of risks, benefits and other options for treatment, the patient has consented to  Procedure(s): ARTHROPLASTY, HIP, TOTAL, ANTERIOR APPROACH (Left) as a surgical intervention.  The patient's history has been reviewed, patient examined, no change in status, stable for surgery.  I have reviewed the patient's chart and labs.  Questions were answered to the patient's satisfaction.     Homero Fellers Clifford Benninger

## 2023-07-17 NOTE — Care Plan (Signed)
 Ortho Bundle Case Management Note  Patient Details  Name: Desiree Caldwell MRN: [id number] Date of Birth: November 01, 1933  L THA on 07-17-23 DCP:  Home with dtr DME:  No needs, has a RW PT:  HEP                   DME Arranged:  N/A DME Agency:  NA  HH Arranged:  NA HH Agency:  NA  Additional Comments: Please contact me with any questions of if this plan should need to change.  Despina Pole, CCM, [place], [phone] 07/17/2023, 9:04 AM

## 2023-07-17 NOTE — Plan of Care (Signed)
   Problem: Education: Goal: Knowledge of General Education information will improve Description Including pain rating scale, medication(s)/side effects and non-pharmacologic comfort measures Outcome: Progressing   Problem: Activity: Goal: Risk for activity intolerance will decrease Outcome: Progressing   Problem: Safety: Goal: Ability to remain free from injury will improve Outcome: Progressing

## 2023-07-17 NOTE — Anesthesia Postprocedure Evaluation (Signed)
 Anesthesia Post Note  Patient: Desiree Caldwell  Procedure(s) Performed: ARTHROPLASTY, HIP, TOTAL, ANTERIOR APPROACH (Left: Hip)     Patient location during evaluation: PACU Anesthesia Type: Spinal Level of consciousness: oriented and awake and alert Pain management: pain level controlled Vital Signs Assessment: post-procedure vital signs reviewed and stable Respiratory status: spontaneous breathing, respiratory function stable and nonlabored ventilation Cardiovascular status: blood pressure returned to baseline and stable Postop Assessment: no headache, no backache and no apparent nausea or vomiting Anesthetic complications: no   No notable events documented.  Last Vitals:  Vitals:   07/17/23 1200 07/17/23 1300  BP: 119/68 127/60  Pulse: 64 63  Resp: 14 14  Temp:    SpO2: 97% 95%    Last Pain:  Vitals:   07/17/23 1300  TempSrc:   PainSc: 0-No pain                 Esthela Brandner A.

## 2023-07-17 NOTE — Transfer of Care (Signed)
 Immediate Anesthesia Transfer of Care Note  Patient: Desiree Caldwell  Procedure(s) Performed: ARTHROPLASTY, HIP, TOTAL, ANTERIOR APPROACH (Left: Hip)  Patient Location: PACU  Anesthesia Type:MAC combined with regional for post-op pain  Level of Consciousness: awake and alert   Airway & Oxygen Therapy: Patient Spontanous Breathing and Patient connected to face mask oxygen  Post-op Assessment: Report given to RN and Post -op Vital signs reviewed and stable  Post vital signs: Reviewed and stable  Last Vitals:  Vitals Value Taken Time  BP 114/51 07/17/23 1115  Temp    Pulse 66 07/17/23 1116  Resp 19 07/17/23 1116  SpO2 96 % 07/17/23 1116  Vitals shown include unfiled device data.  Last Pain:  Vitals:   07/17/23 0729  TempSrc: Oral         Complications: No notable events documented.

## 2023-07-17 NOTE — Discharge Instructions (Signed)
[name], MD Total Joint Specialist [facility] [address] [place], [place] [address] [phone]  ANTERIOR APPROACH TOTAL HIP REPLACEMENT POSTOPERATIVE DIRECTIONS     Hip Rehabilitation, Guidelines Following Surgery  The results of a hip operation are greatly improved after range of motion and muscle strengthening exercises. Follow all safety measures which are given to protect your hip. If any of these exercises cause increased pain or swelling in your joint, decrease the amount until you are comfortable again. Then slowly increase the exercises. Call your caregiver if you have problems or questions.   BLOOD CLOT PREVENTION Take an 81 mg Aspirin two times a day for three weeks following surgery. Then resume one 81 mg Aspirin once a day. You may resume your vitamins/supplements upon discharge from the hospital. Do not take any NSAIDs (Advil, Aleve, Ibuprofen, Meloxicam, etc.) until you have discontinued the 81 mg Aspirin twice a day.  HOME CARE INSTRUCTIONS  Remove items at home which could result in a fall. This includes throw rugs or furniture in walking pathways.  ICE to the affected hip as frequently as 20-30 minutes an hour and then as needed for pain and swelling. Continue to use ice on the hip for pain and swelling from surgery. You may notice swelling that will progress down to the foot and ankle. This is normal after surgery. Elevate the leg when you are not up walking on it.   Continue to use the breathing machine which will help keep your temperature down.  It is common for your temperature to cycle up and down following surgery, especially at night when you are not up moving around and exerting yourself.  The breathing machine keeps your lungs expanded and your temperature down.  DIET You may resume your previous home diet once your are discharged from the hospital.  DRESSING / WOUND CARE / SHOWERING You have an adhesive waterproof  bandage over the incision. Leave this in place until your first follow-up appointment. Once you remove this you will not need to place another bandage.  You may begin showering 3 days following surgery, but do not submerge the incision under water.  ACTIVITY For the first 3-5 days, it is important to rest and keep the operative leg elevated. You should, as a general rule, rest for 50 minutes and walk/stretch for 10 minutes per hour. After 5 days, you may slowly increase activity as tolerated.  Perform the exercises you were provided twice a day for about 15-20 minutes each session. Begin these 2 days following surgery. Walk with your walker as instructed. Use the walker until you are comfortable transitioning to a cane. Walk with the cane in the opposite hand of the operative leg. You may discontinue the cane once you are comfortable and walking steadily. Avoid periods of inactivity such as sitting longer than an hour when not asleep. This helps prevent blood clots.  Do not drive a car for 6 weeks or until released by your surgeon.  Do not drive while taking narcotics.  TED HOSE STOCKINGS Wear the elastic stockings on both legs for three weeks following surgery during the day. You may remove them at night while sleeping.  WEIGHT BEARING Weight bearing as tolerated with assist device (walker, cane, etc) as directed, use it as long as suggested by your surgeon or therapist, typically at least 4-6 weeks.  POSTOPERATIVE CONSTIPATION PROTOCOL Constipation - defined medically as fewer than three stools per week and severe constipation as less than one   stool per week.  One of the most common issues patients have following surgery is constipation.  Even if you have a regular bowel pattern at home, your normal regimen is likely to be disrupted due to multiple reasons following surgery.  Combination of anesthesia, postoperative narcotics, change in appetite and fluid intake all can affect your bowels.  In  order to avoid complications following surgery, here are some recommendations in order to help you during your recovery period.  Colace (docusate) - Pick up an over-the-counter form of Colace or another stool softener and take twice a day as long as you are requiring postoperative pain medications.  Take with a full glass of water daily.  If you experience loose stools or diarrhea, hold the colace until you stool forms back up.  If your symptoms do not get better within 1 week or if they get worse, check with your doctor. Dulcolax (bisacodyl) - Pick up over-the-counter and take as directed by the product packaging as needed to assist with the movement of your bowels.  Take with a full glass of water.  Use this product as needed if not relieved by Colace only.  MiraLax (polyethylene glycol) - Pick up over-the-counter to have on hand.  MiraLax is a solution that will increase the amount of water in your bowels to assist with bowel movements.  Take as directed and can mix with a glass of water, juice, soda, coffee, or tea.  Take if you go more than two days without a movement.Do not use MiraLax more than once per day. Call your doctor if you are still constipated or irregular after using this medication for 7 days in a row.  If you continue to have problems with postoperative constipation, please contact the office for further assistance and recommendations.  If you experience "the worst abdominal pain ever" or develop nausea or vomiting, please contact the office immediatly for further recommendations for treatment.  ITCHING  If you experience itching with your medications, try taking only a single pain pill, or even half a pain pill at a time.  You can also use Benadryl over the counter for itching or also to help with sleep.   MEDICATIONS See your medication summary on the "After Visit Summary" that the nursing staff will review with you prior to discharge.  You may have some home medications which will  be placed on hold until you complete the course of blood thinner medication.  It is important for you to complete the blood thinner medication as prescribed by your surgeon.  Continue your approved medications as instructed at time of discharge.  PRECAUTIONS If you experience chest pain or shortness of breath - call 911 immediately for transfer to the hospital emergency department.  If you develop a fever greater that 101 F, purulent drainage from wound, increased redness or drainage from wound, foul odor from the wound/dressing, or calf pain - CONTACT YOUR SURGEON.                                                   FOLLOW-UP APPOINTMENTS Make sure you keep all of your appointments after your operation with your surgeon and caregivers. You should call the office at the above phone number and make an appointment for approximately two weeks after the date of your surgery or on the date instructed by   your surgeon outlined in the "After Visit Summary".  RANGE OF MOTION AND STRENGTHENING EXERCISES  These exercises are designed to help you keep full movement of your hip joint. Follow your caregiver's or physical therapist's instructions. Perform all exercises about fifteen times, three times per day or as directed. Exercise both hips, even if you have had only one joint replacement. These exercises can be done on a training (exercise) mat, on the floor, on a table or on a bed. Use whatever works the best and is most comfortable for you. Use music or television while you are exercising so that the exercises are a pleasant break in your day. This will make your life better with the exercises acting as a break in routine you can look forward to.  Lying on your back, slowly slide your foot toward your buttocks, raising your knee up off the floor. Then slowly slide your foot back down until your leg is straight again.  Lying on your back spread your legs as far apart as you can without causing discomfort.  Lying on  your side, raise your upper leg and foot straight up from the floor as far as is comfortable. Slowly lower the leg and repeat.  Lying on your back, tighten up the muscle in the front of your thigh (quadriceps muscles). You can do this by keeping your leg straight and trying to raise your heel off the floor. This helps strengthen the largest muscle supporting your knee.  Lying on your back, tighten up the muscles of your buttocks both with the legs straight and with the knee bent at a comfortable angle while keeping your heel on the floor.   POST-OPERATIVE OPIOID TAPER INSTRUCTIONS: It is important to wean off of your opioid medication as soon as possible. If you do not need pain medication after your surgery it is ok to stop day one. Opioids include: Codeine, Hydrocodone(Norco, Vicodin), Oxycodone(Percocet, oxycontin) and hydromorphone amongst others.  Long term and even short term use of opiods can cause: Increased pain response Dependence Constipation Depression Respiratory depression And more.  Withdrawal symptoms can include Flu like symptoms Nausea, vomiting And more Techniques to manage these symptoms Hydrate well Eat regular healthy meals Stay active Use relaxation techniques(deep breathing, meditating, yoga) Do Not substitute Alcohol to help with tapering If you have been on opioids for less than two weeks and do not have pain than it is ok to stop all together.  Plan to wean off of opioids This plan should start within one week post op of your joint replacement. Maintain the same interval or time between taking each dose and first decrease the dose.  Cut the total daily intake of opioids by one tablet each day Next start to increase the time between doses. The last dose that should be eliminated is the evening dose.   IF YOU ARE TRANSFERRED TO A SKILLED REHAB FACILITY If the patient is transferred to a skilled rehab facility following release from the hospital, a list of  the current medications will be sent to the facility for the patient to continue.  When discharged from the skilled rehab facility, please have the facility set up the patient's Home Health Physical Therapy prior to being released. Also, the skilled facility will be responsible for providing the patient with their medications at time of release from the facility to include their pain medication, the muscle relaxants, and their blood thinner medication. If the patient is still at the rehab facility at time of   the two week follow up appointment, the skilled rehab facility will also need to assist the patient in arranging follow up appointment in our office and any transportation needs.  MAKE SURE YOU:  Understand these instructions.  Get help right away if you are not doing well or get worse.    DENTAL ANTIBIOTICS:  In most cases prophylactic antibiotics for Dental procdeures after total joint surgery are not necessary.  Exceptions are as follows:  1. History of prior total joint infection  2. Severely immunocompromised (Organ Transplant, cancer chemotherapy, Rheumatoid biologic meds such as Humera)  3. Poorly controlled diabetes (A1C &gt; 8.0, blood glucose over 200)  If you have one of these conditions, contact your surgeon for an antibiotic prescription, prior to your dental procedure.    Pick up stool softner and laxative for home use following surgery while on pain medications. Do not submerge incision under water. Please use good hand washing techniques while changing dressing each day. May shower starting three days after surgery. Please use a clean towel to pat the incision dry following showers. Continue to use ice for pain and swelling after surgery. Do not use any lotions or creams on the incision until instructed by your surgeon. 

## 2023-07-17 NOTE — Op Note (Signed)
 OPERATIVE REPORT- TOTAL HIP ARTHROPLASTY   PREOPERATIVE DIAGNOSIS: Osteoarthritis of the Left hip.   POSTOPERATIVE DIAGNOSIS: Osteoarthritis of the Left  hip.   PROCEDURE: Left total hip arthroplasty, anterior approach.   SURGEON: Ollen Gross, MD   ASSISTANT: Arcola Jansky, PA-C  ANESTHESIA:  Spinal  ESTIMATED BLOOD LOSS:-200 mL    DRAINS: None  COMPLICATIONS: None   CONDITION: PACU - hemodynamically stable.   BRIEF CLINICAL NOTE: Desiree Caldwell is a 89 y.o. female who has advanced end-  stage arthritis of their Left  hip with progressively worsening pain and  dysfunction.The patient has failed nonoperative management and presents for  total hip arthroplasty.   PROCEDURE IN DETAIL: After successful administration of spinal  anesthetic, the traction boots for the [facility] bed were placed on both  feet and the patient was placed onto the [facility] bed, boots placed into the leg  holders. The Left hip was then isolated from the perineum with plastic  drapes and prepped and draped in the usual sterile fashion. ASIS and  greater trochanter were marked and a oblique incision was made, starting  at about 1 cm lateral and 2 cm distal to the ASIS and coursing towards  the anterior cortex of the femur. The skin was cut with a 10 blade  through subcutaneous tissue to the level of the fascia overlying the  tensor fascia lata muscle. The fascia was then incised in line with the  incision at the junction of the anterior third and posterior 2/3rd. The  muscle was teased off the fascia and then the interval between the TFL  and the rectus was developed. The Hohmann retractor was then placed at  the top of the femoral neck over the capsule. The vessels overlying the  capsule were cauterized and the fat on top of the capsule was removed.  A Hohmann retractor was then placed anterior underneath the rectus  femoris to give exposure to the entire anterior capsule. A T-shaped  capsulotomy  was performed. The edges were tagged and the femoral head  was identified.       Osteophytes are removed off the superior acetabulum.  The femoral neck was then cut in situ with an oscillating saw. Traction  was then applied to the left lower extremity utilizing the [facility]  traction. The femoral head was then removed. Retractors were placed  around the acetabulum and then circumferential removal of the labrum was  performed. Osteophytes were also removed. Reaming starts at 47 mm to  medialize and  Increased in 2 mm increments to 49 mm. We reamed in  approximately 40 degrees of abduction, 20 degrees anteversion. A 50 mm  pinnacle acetabular shell was then impacted in anatomic position under  fluoroscopic guidance with excellent purchase. We did not need to place  any additional dome screws. A 32 mm neutral + 4 marathon liner was then  placed into the acetabular shell.       The femoral lift was then placed along the lateral aspect of the femur  just distal to the vastus ridge. The leg was  externally rotated and capsule  was stripped off the inferior aspect of the femoral neck down to the  level of the lesser trochanter, this was done with electrocautery. The femur was lifted after this was performed. The  leg was then placed in an extended and adducted position essentially delivering the femur. We also removed the capsule superiorly and the piriformis from the piriformis fossa to  gain excellent exposure of the  proximal femur. Rongeur was used to remove some cancellous bone to get  into the lateral portion of the proximal femur for placement of the  initial starter reamer. The starter broaches was placed  the starter broach  and was shown to go down the center of the canal. Broaching  with the Actis system was then performed starting at size 0  coursing  Up to size 5. A size 5 had excellent torsional and rotational  and axial stability. The trial standard offset neck was then placed  with a 32  + 1 trial head. The hip was then reduced. We confirmed that  the stem was in the canal both on AP and lateral x-rays. It also has excellent sizing. The hip was reduced with outstanding stability through full extension and full external rotation.. AP pelvis was taken and the leg lengths were measured and found to be equal. Hip was then dislocated again and the femoral head and neck removed. The  femoral broach was removed. Size 5 Actis stem with a standard offset  neck was then impacted into the femur following native anteversion. Has  excellent purchase in the canal. Excellent torsional and rotational and  axial stability. It is confirmed to be in the canal on AP and lateral  fluoroscopic views. The 32 + 1 metal head was placed and the hip  reduced with outstanding stability. Again AP pelvis was taken and it  confirmed that the leg lengths were equal. The wound was then copiously  irrigated with saline solution and the capsule reattached and repaired  with Ethibond suture. 30 ml of .25% Bupivicaine was  injected into the capsule and into the edge of the tensor fascia lata as well as subcutaneous tissue. The fascia overlying the tensor fascia lata was then closed with a running #1 V-Loc. Subcu was closed with interrupted 2-0 Vicryl and subcuticular running 4-0 Monocryl. Incision was cleaned  and dried. Steri-Strips and a bulky sterile dressing applied. The patient was awakened and transported to  recovery in stable condition.        Please note that a surgical assistant was a medical necessity for this procedure to perform it in a safe and expeditious manner. Assistant was necessary to provide appropriate retraction of vital neurovascular structures and to prevent femoral fracture and allow for anatomic placement of the prosthesis.  Ollen Gross, M.D.

## 2023-07-17 NOTE — Evaluation (Signed)
 Physical Therapy Evaluation Patient Details Name: Desiree Caldwell MRN: [id number] DOB: 03-Feb-1934 Today's Date: 07/17/2023  History of Present Illness  89 yo female presents to therapy s/p L THA, anterior approach on 07/17/2023 due to failure of conservative measures. Pt PMH includes but is not limited to: OP, GERD, HTN, HLD, and R THA (2016).  Clinical Impression    Desiree Caldwell is a 89 y.o. female POD 0 s/p L THA. Patient reports mod I with mobility at baseline. Patient is now limited by functional impairments (see PT problem list below) and requires CGA for bed mobility and CGA and cues for transfers. Patient was able to ambulate 45 feet with RW and CGA level of assist. Patient instructed in exercise to facilitate ROM and circulation to manage edema. Patient will benefit from continued skilled PT interventions to address impairments and progress towards PLOF. Acute PT will follow to progress mobility and stair training in preparation for safe discharge home with family support and HEP.       If plan is discharge home, recommend the following: A little help with walking and/or transfers;A little help with bathing/dressing/bathroom;Assistance with cooking/housework;Assist for transportation;Help with stairs or ramp for entrance   Can travel by private vehicle        Equipment Recommendations None recommended by PT  Recommendations for Other Services       Functional Status Assessment Patient has had a recent decline in their functional status and demonstrates the ability to make significant improvements in function in a reasonable and predictable amount of time.     Precautions / Restrictions Precautions Precautions: Fall Restrictions Weight Bearing Restrictions Per Provider Order: No      Mobility  Bed Mobility Overal bed mobility: Needs Assistance Bed Mobility: Supine to Sit     Supine to sit: HOB elevated, Used rails, Contact guard     General bed mobility comments:  min cues    Transfers Overall transfer level: Needs assistance Equipment used: Rolling walker (2 wheels) Transfers: Sit to/from Stand Sit to Stand: Contact guard assist           General transfer comment: min cues , pull to stand from elevated EOB    Ambulation/Gait Ambulation/Gait assistance: Contact guard assist Gait Distance (Feet): 45 Feet Assistive device: Rolling walker (2 wheels) Gait Pattern/deviations: Step-to pattern, Antalgic, Trunk flexed Gait velocity: decreased     General Gait Details: slight trunk and cervical flexion with B UE support at RW to offload L LE in stance phase, pt indicated feeling it but no real pain with mobility tasks, min cues for posture and RW management  Stairs            Wheelchair Mobility     Tilt Bed    Modified Rankin (Stroke Patients Only)       Balance Overall balance assessment: Needs assistance Sitting-balance support: Feet supported Sitting balance-Leahy Scale: Good     Standing balance support: Bilateral upper extremity supported, Reliant on assistive device for balance, During functional activity Standing balance-Leahy Scale: Poor                               Pertinent Vitals/Pain Pain Assessment Pain Assessment: 0-10 Pain Score: 0-No pain Pain Location: L hip and LE Pain Descriptors / Indicators: Heaviness, Dull, Discomfort Pain Intervention(s): Limited activity within patient's tolerance, Monitored during session, Premedicated before session, Repositioned, Ice applied    Home Living Family/patient expects to be  discharged to:: Private residence Living Arrangements: Alone Available Help at Discharge: Family Type of Home: House Home Access: Stairs to enter Entrance Stairs-Rails: Left Entrance Stairs-Number of Steps: 4   Home Layout: One level Home Equipment: Control and instrumentation engineer (2 wheels);Cane - single point Additional Comments: active PLOF [facility]    Prior Function Prior Level of Function : Independent/Modified Independent;Driving             Mobility Comments: occational use of SPC for L LE instability, IND for all ADLs, self care tasks, and IADLs       Extremity/Trunk Assessment        Lower Extremity Assessment Lower Extremity Assessment: LLE deficits/detail LLE Deficits / Details: ankle DF/PF 5/5 LLE Sensation: decreased light touch    Cervical / Trunk Assessment Cervical / Trunk Assessment: Other exceptions (cervical flexion)  Communication   Communication Communication: No apparent difficulties Factors Affecting Communication: Hearing impaired    Cognition Arousal: Alert Behavior During Therapy: WFL for tasks assessed/performed   PT - Cognitive impairments: No apparent impairments                         Following commands: Intact       Cueing       General Comments      Exercises Total Joint Exercises Ankle Circles/Pumps: AROM, Both, 15 reps   Assessment/Plan    PT Assessment Patient needs continued PT services  PT Problem List Decreased strength;Decreased range of motion;Decreased activity tolerance;Decreased balance;Decreased mobility;Decreased coordination;Pain       PT Treatment Interventions DME instruction;Gait training;Stair training;Functional mobility training;Therapeutic activities;Therapeutic exercise;Balance training;Neuromuscular re-education;Patient/family education;Modalities    PT Goals (Current goals can be found in the Care Plan section)  Acute Rehab PT Goals Patient Stated Goal: be able to amb with [facility] and be able to go to [employer] and [facility] PT Goal Formulation: With patient Time For Goal Achievement: 07/31/23 Potential to Achieve Goals: Good    Frequency 7X/week     Co-evaluation               AM-PAC PT "6 Clicks" Mobility  Outcome Measure Help needed turning from your back to your side while in a  flat bed without using bedrails?: None Help needed moving from lying on your back to sitting on the side of a flat bed without using bedrails?: A Little Help needed moving to and from a bed to a chair (including a wheelchair)?: A Little Help needed standing up from a chair using your arms (e.g., wheelchair or bedside chair)?: A Little Help needed to walk in hospital room?: A Little Help needed climbing 3-5 steps with a railing? : A Lot 6 Click Score: 18    End of Session Equipment Utilized During Treatment: Gait belt Activity Tolerance: Patient tolerated treatment well;No increased pain Patient left: in chair;with call bell/phone within reach Nurse Communication: Mobility status PT Visit Diagnosis: Unsteadiness on feet (R26.81);Other abnormalities of gait and mobility (R26.89);Muscle weakness (generalized) (M62.81);Difficulty in walking, not elsewhere classified (R26.2);Pain Pain - Right/Left: Left Pain - part of body: Leg;Hip    Time: 8416-6063 PT Time Calculation (min) (ACUTE ONLY): 33 min   Charges:   PT Evaluation $PT Eval Low Complexity: 1 Low PT Treatments $Gait Training: 8-22 mins PT General Charges $$ ACUTE PT VISIT: 1 Visit         Johnny Bridge, PT Acute Rehab  Jacqualyn Posey 07/17/2023, 5:07 PM

## 2023-07-18 ENCOUNTER — Other Ambulatory Visit (HOSPITAL_COMMUNITY): Payer: Self-pay

## 2023-07-18 ENCOUNTER — Encounter (HOSPITAL_COMMUNITY): Payer: Self-pay | Admitting: Orthopedic Surgery

## 2023-07-18 DIAGNOSIS — I10 Essential (primary) hypertension: Secondary | ICD-10-CM | POA: Diagnosis not present

## 2023-07-18 DIAGNOSIS — M1612 Unilateral primary osteoarthritis, left hip: Secondary | ICD-10-CM | POA: Diagnosis not present

## 2023-07-18 DIAGNOSIS — Z96641 Presence of right artificial hip joint: Secondary | ICD-10-CM | POA: Diagnosis not present

## 2023-07-18 DIAGNOSIS — Z7982 Long term (current) use of aspirin: Secondary | ICD-10-CM | POA: Diagnosis not present

## 2023-07-18 DIAGNOSIS — Z85828 Personal history of other malignant neoplasm of skin: Secondary | ICD-10-CM | POA: Diagnosis not present

## 2023-07-18 DIAGNOSIS — Z79899 Other long term (current) drug therapy: Secondary | ICD-10-CM | POA: Diagnosis not present

## 2023-07-18 LAB — CBC
HCT: 34.3 % — ABNORMAL LOW (ref 36.0–46.0)
Hemoglobin: 11.1 g/dL — ABNORMAL LOW (ref 12.0–15.0)
MCH: 30 pg (ref 26.0–34.0)
MCHC: 32.4 g/dL (ref 30.0–36.0)
MCV: 92.7 fL (ref 80.0–100.0)
Platelets: 191 10*3/uL (ref 150–400)
RBC: 3.7 MIL/uL — ABNORMAL LOW (ref 3.87–5.11)
RDW: 14.2 % (ref 11.5–15.5)
WBC: 9.8 10*3/uL (ref 4.0–10.5)
nRBC: 0 % (ref 0.0–0.2)

## 2023-07-18 LAB — BASIC METABOLIC PANEL
Anion gap: 7 (ref 5–15)
BUN: 14 mg/dL (ref 8–23)
CO2: 25 mmol/L (ref 22–32)
Calcium: 8.2 mg/dL — ABNORMAL LOW (ref 8.9–10.3)
Chloride: 106 mmol/L (ref 98–111)
Creatinine, Ser: 0.88 mg/dL (ref 0.44–1.00)
GFR, Estimated: >60 mL/min (ref >60)
Glucose, Bld: 137 mg/dL — ABNORMAL HIGH (ref 70–99)
Potassium: 3.5 mmol/L (ref 3.5–5.1)
Sodium: 138 mmol/L (ref 135–145)

## 2023-07-18 MED ORDER — METHOCARBAMOL 500 MG PO TABS
500.0000 mg | ORAL_TABLET | Freq: Four times a day (QID) | ORAL | 0 refills | Status: AC | PRN
  Filled 2023-07-18: qty 40, 10d supply, fill #0

## 2023-07-18 MED ORDER — ONDANSETRON HCL 4 MG PO TABS
4.0000 mg | ORAL_TABLET | Freq: Four times a day (QID) | ORAL | 0 refills | Status: AC | PRN
  Filled 2023-07-18: qty 20, 5d supply, fill #0

## 2023-07-18 MED ORDER — SODIUM CHLORIDE 0.9 % IV BOLUS
250.0000 mL | Freq: Once | INTRAVENOUS | Status: AC
  Administered 2023-07-18: 250 mL via INTRAVENOUS

## 2023-07-18 MED ORDER — HYDROCODONE-ACETAMINOPHEN 5-325 MG PO TABS
1.0000 | ORAL_TABLET | Freq: Four times a day (QID) | ORAL | 0 refills | Status: AC | PRN
Start: 2023-07-18 — End: ?
  Filled 2023-07-18: qty 42, 6d supply, fill #0

## 2023-07-18 MED ORDER — SODIUM CHLORIDE 0.9 % IV BOLUS
500.0000 mL | Freq: Once | INTRAVENOUS | Status: AC
  Administered 2023-07-18: 500 mL via INTRAVENOUS

## 2023-07-18 MED ORDER — TRAMADOL HCL 50 MG PO TABS
50.0000 mg | ORAL_TABLET | Freq: Four times a day (QID) | ORAL | 0 refills | Status: AC | PRN
  Filled 2023-07-18: qty 40, 5d supply, fill #0

## 2023-07-18 MED ORDER — ASPIRIN 81 MG PO CHEW
81.0000 mg | CHEWABLE_TABLET | Freq: Two times a day (BID) | ORAL | 0 refills | Status: AC
  Filled 2023-07-18: qty 40, 20d supply, fill #0

## 2023-07-18 NOTE — Care Management Obs Status (Signed)
 MEDICARE OBSERVATION STATUS NOTIFICATION   Patient Details  Name: Desiree Caldwell MRN: [id number] Date of Birth: [birth year]   Medicare Observation Status Notification Given:  Hart Robinsons, LCSW 07/18/2023, 4:49 PM

## 2023-07-18 NOTE — TOC Transition Note (Signed)
 Transition of Care [facility]) - Discharge Note   Patient Details  Name: Desiree Caldwell MRN: [id number] Date of Birth: 1933-06-12  Transition of Care [facility]) CM/SW Contact:  Amada Jupiter, LCSW Phone Number: 07/18/2023, 10:53 AM   Clinical Narrative:     Met with pt who confirms she has needed DME in the home.  Plan for HEP.  No further TOC needs.  Final next level of care: Home/Self Care Barriers to Discharge: No Barriers Identified   Patient Goals and CMS Choice Patient states their goals for this hospitalization and ongoing recovery are:: return home          Discharge Placement                       Discharge Plan and Services Additional resources added to the After Visit Summary for                  DME Arranged: N/A DME Agency: NA       HH Arranged: NA HH Agency: NA        Social Drivers of Health (SDOH) Interventions SDOH Screenings   Food Insecurity: Patient Declined (07/17/2023)  Housing: Patient Declined (07/17/2023)  Transportation Needs: Patient Declined (07/17/2023)  Utilities: Patient Declined (07/17/2023)  Social Connections: Unknown (07/17/2023)  Tobacco Use: Low Risk  (07/17/2023)     Readmission Risk Interventions     No data to display

## 2023-07-18 NOTE — Progress Notes (Signed)
 Physical Therapy Treatment Patient Details Name: Desiree Caldwell MRN: [id number] DOB: [birth year] Today's Date: 07/18/2023   History of Present Illness 89 yo female presents to therapy s/p L THA, anterior approach on 07/17/2023 due to failure of conservative measures. Pt PMH includes but is not limited to: OP, GERD, HTN, HLD, and R THA (2016).    PT Comments  POD # 1 am session PT - Cognition Comments: AxO x 3 pleasant and motivated.  Lives home with spouse and she goes to [facility] 3 - 5 days a week. Pt received an IV Bolus earlier Pt in bed on 1 lt nasal sats 94%.  Assisted to EOB.   General bed mobility comments: demonstarted and Educated on how to use a belt to self assist LE "slide" to transition to EOB requiring increased time. Took Orthostatic vitals with RN EOB            BP 113/55  HR 89 Standing      BP  98/60  HR 97 Amb 5 feet   BP  73/44  HR 91 MAX dizzy General Gait Details: distance limited to 5 feet due to MAX c/o dizziness and BP 73/44.  Quickly asissted to recliner and placed in supine with elevated B LE.  3 min recovery BP 110/51 felling "a little better".  RN present during session and assisted with Orthostatic vitals. Left pt in room with RN. Will see Pt again later today.    If plan is discharge home, recommend the following: A little help with walking and/or transfers;A little help with bathing/dressing/bathroom;Assistance with cooking/housework;Assist for transportation;Help with stairs or ramp for entrance   Can travel by private vehicle        Equipment Recommendations  None recommended by PT    Recommendations for Other Services       Precautions / Restrictions Precautions Precautions: Fall Restrictions Weight Bearing Restrictions Per Provider Order: No Other Position/Activity Restrictions: WBAT     Mobility  Bed Mobility Overal bed mobility: Needs Assistance Bed Mobility: Supine to Sit     Supine to sit: HOB elevated, Used rails, Contact guard      General bed mobility comments: demonstarted and Educated on how to use a belt to self assist LE "slide" to transition to EOB requiring increased time.    Transfers Overall transfer level: Needs assistance Equipment used: Rolling walker (2 wheels) Transfers: Sit to/from Stand Sit to Stand: Supervision, Contact guard assist           General transfer comment: 25% VC's on proper hand placement and increased time to rise from elevated surface.    Ambulation/Gait Ambulation/Gait assistance: Contact guard assist, Min assist Gait Distance (Feet): 5 Feet Assistive device: Rolling walker (2 wheels) Gait Pattern/deviations: Step-to pattern, Antalgic, Trunk flexed Gait velocity: decreased     General Gait Details: distance limited to 5 feet due to MAX c/o dizziness and BP 73/44.  Quickly asissted to recliner and placed in supine with elevated B LE.  3 min recovery BP 110/51 felling "a little better".  RN present during session and assisted with Orthostatic vitals.   Stairs             Wheelchair Mobility     Tilt Bed    Modified Rankin (Stroke Patients Only)       Balance  Communication    Cognition Arousal: Alert Behavior During Therapy: WFL for tasks assessed/performed   PT - Cognitive impairments: No apparent impairments                       PT - Cognition Comments: AxO x 3 pleasant and motivated.  Lives home with spouse and she goes to [facility] 3 - 5 days a week. Following commands: Intact      Cueing    Exercises      General Comments        Pertinent Vitals/Pain Pain Assessment Pain Assessment: 0-10 Pain Score: 5  Pain Location: L hip and LE Pain Descriptors / Indicators: Operative site guarding, Tender Pain Intervention(s): Monitored during session, Premedicated before session, Repositioned, Ice applied    Home Living                          Prior Function             PT Goals (current goals can now be found in the care plan section) Progress towards PT goals: Progressing toward goals    Frequency    7X/week      PT Plan      Co-evaluation              AM-PAC PT "6 Clicks" Mobility   Outcome Measure  Help needed turning from your back to your side while in a flat bed without using bedrails?: A Little Help needed moving from lying on your back to sitting on the side of a flat bed without using bedrails?: A Little Help needed moving to and from a bed to a chair (including a wheelchair)?: A Little Help needed standing up from a chair using your arms (e.g., wheelchair or bedside chair)?: A Little Help needed to walk in hospital room?: A Lot Help needed climbing 3-5 steps with a railing? : Total 6 Click Score: 15    End of Session Equipment Utilized During Treatment: Gait belt Activity Tolerance: Other (comment) (Hypotensive) Patient left: in chair;with call bell/phone within reach Nurse Communication: Mobility status PT Visit Diagnosis: Unsteadiness on feet (R26.81);Other abnormalities of gait and mobility (R26.89);Muscle weakness (generalized) (M62.81);Difficulty in walking, not elsewhere classified (R26.2);Pain Pain - Right/Left: Left     Time: 7829-5621 PT Time Calculation (min) (ACUTE ONLY): 23 min  Charges:    $Gait Training: 8-22 mins $Therapeutic Activity: 8-22 mins PT General Charges $$ ACUTE PT VISIT: 1 Visit                     Felecia Shelling  PTA Acute  Rehabilitation Services Office M-F          [phone]

## 2023-07-18 NOTE — Progress Notes (Signed)
 Physical Therapy Treatment Patient Details Name: Desiree Caldwell MRN: [id number] DOB: 12/09/1933 Today's Date: 07/18/2023   History of Present Illness 89 yo female presents to therapy s/p L THA, anterior approach on 07/17/2023 due to failure of conservative measures. Pt PMH includes but is not limited to: OP, GERD, HTN, HLD, and R THA (2016).    PT Comments  POD # 1 pm session PT - Cognition Comments: AxO x 3 pleasant and motivated.  Lives home with spouse and she goes to [facility] 3 - 5 days a week. Seen after her second Bolus Assisted out of recliner and took Orthostatic vitals with NT Sitting      BP 109/53       HR 76 Standing  BP 103/54       HR 88 3 min amb    BP 120/60   HR 93 NO c/o dizziness Pt tolerated amb 40 feet with walker.  Then assisted back to bed and positioned to comfort. Pt did NOT meet her mobility goals.  Have yet to practice stairs.      If plan is discharge home, recommend the following: A little help with walking and/or transfers;A little help with bathing/dressing/bathroom;Assistance with cooking/housework;Assist for transportation;Help with stairs or ramp for entrance   Can travel by private vehicle        Equipment Recommendations  None recommended by PT    Recommendations for Other Services       Precautions / Restrictions Precautions Precautions: Fall Restrictions Weight Bearing Restrictions Per Provider Order: No Other Position/Activity Restrictions: WBAT     Mobility  Bed Mobility Overal bed mobility: Needs Assistance Bed Mobility: Sit to Supine     Supine to sit: HOB elevated, Used rails, Contact guard Sit to supine: Min assist   General bed mobility comments: required Min Assist to support LE up onto bed then positioned to comfort.    Transfers Overall transfer level: Needs assistance Equipment used: Rolling walker (2 wheels) Transfers: Sit to/from Stand Sit to Stand: Supervision, Contact guard assist           General transfer  comment: 25% VC's on proper hand placement and increased time to rise from elevated surface.    Ambulation/Gait Ambulation/Gait assistance: Contact guard assist, Min assist Gait Distance (Feet): 40 Feet Assistive device: Rolling walker (2 wheels) Gait Pattern/deviations: Step-to pattern, Antalgic, Trunk flexed Gait velocity: decreased     General Gait Details: Received a secvond Bolus.  Tolerated an increased distance with NO c/o dizziness.  BP 120/60 with HR 93 during amb.  Much improved from this morning.   Stairs             Wheelchair Mobility     Tilt Bed    Modified Rankin (Stroke Patients Only)       Balance                                            Communication    Cognition Arousal: Alert Behavior During Therapy: WFL for tasks assessed/performed   PT - Cognitive impairments: No apparent impairments                       PT - Cognition Comments: AxO x 3 pleasant and motivated.  Lives home with spouse and she goes to [facility] 3 - 5 days a week. Following commands: Intact  Cueing    Exercises      General Comments        Pertinent Vitals/Pain Pain Assessment Pain Assessment: 0-10 Pain Score: 3  Pain Location: L hip and LE Pain Descriptors / Indicators: Operative site guarding, Tender Pain Intervention(s): Monitored during session, Premedicated before session, Repositioned, Ice applied    Home Living                          Prior Function            PT Goals (current goals can now be found in the care plan section) Progress towards PT goals: Progressing toward goals    Frequency    7X/week      PT Plan      Co-evaluation              AM-PAC PT "6 Clicks" Mobility   Outcome Measure  Help needed turning from your back to your side while in a flat bed without using bedrails?: A Little Help needed moving from lying on your back to sitting on the side of a flat bed without using  bedrails?: A Little Help needed moving to and from a bed to a chair (including a wheelchair)?: A Little Help needed standing up from a chair using your arms (e.g., wheelchair or bedside chair)?: A Little Help needed to walk in hospital room?: A Little Help needed climbing 3-5 steps with a railing? : A Lot 6 Click Score: 17    End of Session Equipment Utilized During Treatment: Gait belt Activity Tolerance: Patient tolerated treatment well Patient left: in bed;with call bell/phone within reach;with bed alarm set Nurse Communication: Mobility status PT Visit Diagnosis: Unsteadiness on feet (R26.81);Other abnormalities of gait and mobility (R26.89);Muscle weakness (generalized) (M62.81);Difficulty in walking, not elsewhere classified (R26.2);Pain Pain - Right/Left: Left Pain - part of body: Leg;Hip     Time: 1451-1515 PT Time Calculation (min) (ACUTE ONLY): 24 min  Charges:    $Gait Training: 8-22 mins $Therapeutic Activity: 8-22 mins PT General Charges $$ ACUTE PT VISIT: 1 Visit                     {Jaclynne Baldo  PTA Acute  Rehabilitation Services Office M-F          [phone]

## 2023-07-18 NOTE — Plan of Care (Signed)
  Problem: Safety: Goal: Ability to remain free from injury will improve Outcome: Progressing   Problem: Education: Goal: Knowledge of the prescribed therapeutic regimen will improve Outcome: Progressing   Problem: Activity: Goal: Ability to avoid complications of mobility impairment will improve Outcome: Progressing   Problem: Clinical Measurements: Goal: Postoperative complications will be avoided or minimized Outcome: Progressing   Problem: Pain Management: Goal: Pain level will decrease with appropriate interventions Outcome: Progressing

## 2023-07-18 NOTE — Progress Notes (Signed)
   Subjective: 1 Day Post-Op Procedure(s) (LRB): ARTHROPLASTY, HIP, TOTAL, ANTERIOR APPROACH (Left) Patient seen in rounds by Dr. Lequita Halt. Patient is well, and has had no acute complaints or problems. Denies SOB or chest pain. Denies calf pain. Foley cath removed this AM. Patient reports pain as moderate. Worked with physical therapy yesterday and ambulated 45'. We will continue physical therapy today.   Objective: Vital signs in last 24 hours: Temp:  [97.5 F (36.4 C)-99 F (37.2 C)] 99 F (37.2 C) (03/06 0625) Pulse Rate:  [60-81] 78 (03/06 0625) Resp:  [13-18] 17 (03/06 0625) BP: (98-159)/(44-76) 98/44 (03/06 0625) SpO2:  [91 %-100 %] 94 % (03/06 6578) Weight:  [62.9 kg] 62.9 kg (03/05 0729)  Intake/Output from previous day:  Intake/Output Summary (Last 24 hours) at 07/18/2023 0728 Last data filed at 07/18/2023 0640 Gross per 24 hour  Intake 4069.4 ml  Output 3376 ml  Net 693.4 ml     Intake/Output this shift: No intake/output data recorded.  Labs: Recent Labs    07/18/23 0317  HGB 11.1*   Recent Labs    07/18/23 0317  WBC 9.8  RBC 3.70*  HCT 34.3*  PLT 191   Recent Labs    07/18/23 0317  NA 138  K 3.5  CL 106  CO2 25  BUN 14  CREATININE 0.88  GLUCOSE 137*  CALCIUM 8.2*   No results for input(s): "LABPT", "INR" in the last 72 hours.  Exam: General - Patient is Alert and Oriented Extremity - Neurologically intact Neurovascular intact Sensation intact distally Dorsiflexion/Plantar flexion intact Dressing - dressing C/D/I Motor Function - intact, moving foot and toes well on exam.  Past Medical History:  Diagnosis Date   Arthritis    Complication of anesthesia    SLOW TO WAKE UP   Difficulty sleeping    DUE TO PAIN   Diverticulosis    GERD (gastroesophageal reflux disease)    Hyperlipidemia    Hypertension    Osteopenia    Seasonal allergies    Skin cancer    Face and hand    Assessment/Plan: 1 Day Post-Op Procedure(s)  (LRB): ARTHROPLASTY, HIP, TOTAL, ANTERIOR APPROACH (Left) Principal Problem:   OA (osteoarthritis) of hip Active Problems:   Osteoarthritis of left hip  Estimated body mass index is 24.55 kg/m as calculated from the following:   Height as of this encounter: 5\' 3"  (1.6 m).   Weight as of this encounter: 62.9 kg. Advance diet Up with therapy D/C IV fluids  DVT Prophylaxis - Aspirin Weight bearing as tolerated.  Continue physical therapy. Expected discharge home today pending progress with physical therapy and if meeting patient goals. Will do HEP once discharged. Follow-up in clinic in 2 weeks.  The PDMP database was reviewed today prior to any opioid medications being prescribed to this patient.  R. Arcola Jansky, PA-C Orthopedic Surgery 07/18/2023, 7:28 AM

## 2023-07-19 ENCOUNTER — Other Ambulatory Visit (HOSPITAL_COMMUNITY): Payer: Self-pay

## 2023-07-19 DIAGNOSIS — Z85828 Personal history of other malignant neoplasm of skin: Secondary | ICD-10-CM | POA: Diagnosis not present

## 2023-07-19 DIAGNOSIS — M1612 Unilateral primary osteoarthritis, left hip: Secondary | ICD-10-CM | POA: Diagnosis not present

## 2023-07-19 DIAGNOSIS — I10 Essential (primary) hypertension: Secondary | ICD-10-CM | POA: Diagnosis not present

## 2023-07-19 DIAGNOSIS — Z7982 Long term (current) use of aspirin: Secondary | ICD-10-CM | POA: Diagnosis not present

## 2023-07-19 DIAGNOSIS — Z79899 Other long term (current) drug therapy: Secondary | ICD-10-CM | POA: Diagnosis not present

## 2023-07-19 DIAGNOSIS — Z96641 Presence of right artificial hip joint: Secondary | ICD-10-CM | POA: Diagnosis not present

## 2023-07-19 LAB — CBC
HCT: 34.5 % — ABNORMAL LOW (ref 36.0–46.0)
Hemoglobin: 11.1 g/dL — ABNORMAL LOW (ref 12.0–15.0)
MCH: 29.7 pg (ref 26.0–34.0)
MCHC: 32.2 g/dL (ref 30.0–36.0)
MCV: 92.2 fL (ref 80.0–100.0)
Platelets: 173 10*3/uL (ref 150–400)
RBC: 3.74 MIL/uL — ABNORMAL LOW (ref 3.87–5.11)
RDW: 14.3 % (ref 11.5–15.5)
WBC: 11.9 10*3/uL — ABNORMAL HIGH (ref 4.0–10.5)
nRBC: 0 % (ref 0.0–0.2)

## 2023-07-19 NOTE — Progress Notes (Signed)
   Subjective: 2 Days Post-Op Procedure(s) (LRB): ARTHROPLASTY, HIP, TOTAL, ANTERIOR APPROACH (Left) Patient seen in rounds by Dr. Lequita Halt. Patient is well, and has had no acute complaints or problems. No issues with dizziness or lightheadedness yesterday afternoon or this morning. Patient reports pain as moderate.    Objective: Vital signs in last 24 hours: Temp:  [97.6 F (36.4 C)-98.3 F (36.8 C)] 97.6 F (36.4 C) (03/07 0519) Pulse Rate:  [64-85] 72 (03/07 0519) Resp:  [16-18] 16 (03/07 0519) BP: (91-121)/(45-61) 115/51 (03/07 0519) SpO2:  [89 %-97 %] 97 % (03/07 0519)  Intake/Output from previous day:  Intake/Output Summary (Last 24 hours) at 07/19/2023 0753 Last data filed at 07/19/2023 0707 Gross per 24 hour  Intake 1143.79 ml  Output 1800 ml  Net -656.21 ml    Intake/Output this shift: Total I/O In: -  Out: 300 [Urine:300]  Labs: Recent Labs    07/18/23 0317 07/19/23 0314  HGB 11.1* 11.1*   Recent Labs    07/18/23 0317 07/19/23 0314  WBC 9.8 11.9*  RBC 3.70* 3.74*  HCT 34.3* 34.5*  PLT 191 173   Recent Labs    07/18/23 0317  NA 138  K 3.5  CL 106  CO2 25  BUN 14  CREATININE 0.88  GLUCOSE 137*  CALCIUM 8.2*   No results for input(s): "LABPT", "INR" in the last 72 hours.  Exam: General - Patient is Alert and Oriented Extremity - Neurologically intact Neurovascular intact Sensation intact distally Dorsiflexion/Plantar flexion intact Dressing/Incision - clean, dry, no drainage Motor Function - intact, moving foot and toes well on exam.  Past Medical History:  Diagnosis Date   Arthritis    Complication of anesthesia    SLOW TO WAKE UP   Difficulty sleeping    DUE TO PAIN   Diverticulosis    GERD (gastroesophageal reflux disease)    Hyperlipidemia    Hypertension    Osteopenia    Seasonal allergies    Skin cancer    Face and hand    Assessment/Plan: 2 Days Post-Op Procedure(s) (LRB): ARTHROPLASTY, HIP, TOTAL, ANTERIOR APPROACH  (Left) Principal Problem:   OA (osteoarthritis) of hip Active Problems:   Osteoarthritis of left hip  Estimated body mass index is 24.55 kg/m as calculated from the following:   Height as of this encounter: 5\' 3"  (1.6 m).   Weight as of this encounter: 62.9 kg.  DVT Prophylaxis - Aspirin Weight-bearing as tolerated.  Continue physical therapy this AM and then discharge home. Will do HEP. F/u in clinic in 2 weeks.  Alfonzo Feller, PA-C Orthopedic Surgery 07/19/2023, 7:53 AM

## 2023-07-19 NOTE — Progress Notes (Signed)
 Physical Therapy Treatment Patient Details Name: Desiree Caldwell MRN: [id number] DOB: 1933-07-10 Today's Date: 07/19/2023   History of Present Illness 89 yo female presents to therapy s/p L THA, anterior approach on 07/17/2023 due to failure of conservative measures. Pt PMH includes but is not limited to: OP, GERD, HTN, HLD, and R THA (2016).    PT Comments  POD # 2 am session PT - Cognition Comments: AxO x 3 pleasant and motivated.  Lives home with spouse and she goes to [facility] 3 - 5 days a week. Feeling "better". Assisted with amb to bathroom.  General transfer comment: self able with increased safety.  Also self able to perform a tiolet transfer. General Gait Details: Pt tolerated an increased distance with NO issues.  Feeling "better". Practiced stairs.  General stair comments: 25% VC's on proper sequencing/safety.  Pt tolerated well. Then returned to room to perform some TE's following HEP handout.  Instructed on proper tech, freq as well as use of ICE.   Addressed all mobility questions, discussed appropriate activity, educated on use of ICE.  Pt ready for D/C to home.    If plan is discharge home, recommend the following: A little help with walking and/or transfers;A little help with bathing/dressing/bathroom;Assistance with cooking/housework;Assist for transportation;Help with stairs or ramp for entrance   Can travel by private vehicle        Equipment Recommendations  None recommended by PT    Recommendations for Other Services       Precautions / Restrictions Precautions Precautions: Fall Restrictions Weight Bearing Restrictions Per Provider Order: No     Mobility  Bed Mobility Overal bed mobility: Needs Assistance Bed Mobility: Supine to Sit     Supine to sit: HOB elevated, Used rails, Contact guard, Supervision     General bed mobility comments: self able with increased time    Transfers Overall transfer level: Needs assistance Equipment used: Rolling walker (2  wheels) Transfers: Sit to/from Stand Sit to Stand: Supervision, Contact guard assist           General transfer comment: self able with increased safety.  Also self able to perform a tiolet transfer.    Ambulation/Gait Ambulation/Gait assistance: Supervision Gait Distance (Feet): 65 Feet Assistive device: Rolling walker (2 wheels) Gait Pattern/deviations: Step-to pattern, Antalgic, Trunk flexed Gait velocity: decreased     General Gait Details: Pt tolerated an increased distance with NO issues.  Feeling "better".   Stairs Stairs: Yes Stairs assistance: Supervision, Contact guard assist Stair Management: One rail Left, Step to pattern, Sideways, Forwards Number of Stairs: 3 General stair comments: 25% VC's on proper sequencing/safety.  Pt tolerated well.   Wheelchair Mobility     Tilt Bed    Modified Rankin (Stroke Patients Only)       Balance                                            Communication Communication Communication: No apparent difficulties  Cognition Arousal: Alert Behavior During Therapy: WFL for tasks assessed/performed   PT - Cognitive impairments: No apparent impairments                       PT - Cognition Comments: AxO x 3 pleasant and motivated.  Lives home with spouse and she goes to [facility] 3 - 5 days a week. Feeling "better". Following commands:  Intact      Cueing Cueing Techniques: Verbal cues  Exercises      General Comments        Pertinent Vitals/Pain Pain Assessment Pain Assessment: No/denies pain Pain Location: L hip and LE Pain Descriptors / Indicators: Operative site guarding, Tender Pain Intervention(s): Monitored during session, Premedicated before session, Repositioned, Ice applied    Home Living                          Prior Function            PT Goals (current goals can now be found in the care plan section) Progress towards PT goals: Progressing toward goals     Frequency    7X/week      PT Plan      Co-evaluation              AM-PAC PT "6 Clicks" Mobility   Outcome Measure  Help needed turning from your back to your side while in a flat bed without using bedrails?: None Help needed moving from lying on your back to sitting on the side of a flat bed without using bedrails?: None Help needed moving to and from a bed to a chair (including a wheelchair)?: None Help needed standing up from a chair using your arms (e.g., wheelchair or bedside chair)?: None Help needed to walk in hospital room?: A Little Help needed climbing 3-5 steps with a railing? : A Little 6 Click Score: 22    End of Session Equipment Utilized During Treatment: Gait belt Activity Tolerance: Patient tolerated treatment well Patient left: in chair;with call bell/phone within reach Nurse Communication: Mobility status PT Visit Diagnosis: Unsteadiness on feet (R26.81);Other abnormalities of gait and mobility (R26.89);Muscle weakness (generalized) (M62.81);Difficulty in walking, not elsewhere classified (R26.2);Pain Pain - Right/Left: Left Pain - part of body: Leg;Hip     Time: 8413-2440 PT Time Calculation (min) (ACUTE ONLY): 26 min  Charges:    $Gait Training: 8-22 mins $Therapeutic Activity: 8-22 mins PT General Charges $$ ACUTE PT VISIT: 1 Visit                     Felecia Shelling  PTA Acute  Rehabilitation Services Office M-F          [phone]

## 2023-07-19 NOTE — Progress Notes (Signed)
 AVS provided to patient. Patient is now waiting for her daughter to arrive.

## 2023-07-22 NOTE — Discharge Summary (Signed)
 Physician Discharge Summary   Patient ID: Desiree Caldwell MRN: [id number] DOB/AGE: Jun 24, 1933 89 y.o.  Admit date: 07/17/2023 Discharge date: 07/19/2023  Primary Diagnosis: Osteoarthritis of the left hip   Admission Diagnoses:  Past Medical History:  Diagnosis Date   Arthritis    Complication of anesthesia    SLOW TO WAKE UP   Difficulty sleeping    DUE TO PAIN   Diverticulosis    GERD (gastroesophageal reflux disease)    Hyperlipidemia    Hypertension    Osteopenia    Seasonal allergies    Skin cancer    Face and hand   Discharge Diagnoses:   Principal Problem:   OA (osteoarthritis) of hip Active Problems:   Osteoarthritis of left hip  Estimated body mass index is 24.55 kg/m as calculated from the following:   Height as of this encounter: 5\' 3"  (1.6 m).   Weight as of this encounter: 62.9 kg.  Procedure:  Procedure(s) (LRB): ARTHROPLASTY, HIP, TOTAL, ANTERIOR APPROACH (Left)   Consults: None  HPI: Desiree Caldwell is a 89 y.o. female who has advanced end-stage arthritis of their Left  hip with progressively worsening pain and dysfunction.The patient has failed nonoperative management and presents for total hip arthroplasty.   Laboratory Data: Admission on 07/17/2023, Discharged on 07/19/2023  Component Date Value Ref Range Status   WBC 07/18/2023 9.8  4.0 - 10.5 K/uL Final   RBC 07/18/2023 3.70 (L)  3.87 - 5.11 MIL/uL Final   Hemoglobin 07/18/2023 11.1 (L)  12.0 - 15.0 g/dL Final   HCT 40/98/1191 34.3 (L)  36.0 - 46.0 % Final   MCV 07/18/2023 92.7  80.0 - 100.0 fL Final   MCH 07/18/2023 30.0  26.0 - 34.0 pg Final   MCHC 07/18/2023 32.4  30.0 - 36.0 g/dL Final   RDW 47/82/9562 14.2  11.5 - 15.5 % Final   Platelets 07/18/2023 191  150 - 400 K/uL Final   nRBC 07/18/2023 0.0  0.0 - 0.2 % Final   Performed at [facility], [address], [place], [place] [address]   Sodium 07/18/2023 138  135 - 145 mmol/L Final   Potassium 07/18/2023 3.5  3.5  - 5.1 mmol/L Final   Chloride 07/18/2023 106  98 - 111 mmol/L Final   CO2 07/18/2023 25  22 - 32 mmol/L Final   Glucose, Bld 07/18/2023 137 (H)  70 - 99 mg/dL Final   Glucose reference range applies only to samples taken after fasting for at least 8 hours.   BUN 07/18/2023 14  8 - 23 mg/dL Final   Creatinine, Ser 07/18/2023 0.88  0.44 - 1.00 mg/dL Final   Calcium 57/84/6962 8.2 (L)  8.9 - 10.3 mg/dL Final   GFR, Estimated 07/18/2023 >60  >60 mL/min Final   Comment: (NOTE) Calculated using the CKD-EPI Creatinine Equation (2021)    Anion gap 07/18/2023 7  5 - 15 Final   Performed at [facility], [address], [place], [place] [address]   WBC 07/19/2023 11.9 (H)  4.0 - 10.5 K/uL Final   RBC 07/19/2023 3.74 (L)  3.87 - 5.11 MIL/uL Final   Hemoglobin 07/19/2023 11.1 (L)  12.0 - 15.0 g/dL Final   HCT 13/24/4010 34.5 (L)  36.0 - 46.0 % Final   MCV 07/19/2023 92.2  80.0 - 100.0 fL Final   MCH 07/19/2023 29.7  26.0 - 34.0 pg Final   MCHC 07/19/2023 32.2  30.0 - 36.0 g/dL Final   RDW  07/19/2023 14.3  11.5 - 15.5 % Final   Platelets 07/19/2023 173  150 - 400 K/uL Final   nRBC 07/19/2023 0.0  0.0 - 0.2 % Final   Performed at [facility], [address], [place], [place] [address]  Hospital Outpatient Visit on 07/09/2023  Component Date Value Ref Range Status   MRSA, PCR 07/09/2023 NEGATIVE  NEGATIVE Final   Staphylococcus aureus 07/09/2023 NEGATIVE  NEGATIVE Final   Comment: (NOTE) The Xpert SA Assay (FDA approved for NASAL specimens in patients 52 years of age and older), is one component of a comprehensive surveillance program. It is not intended to diagnose infection nor to guide or monitor treatment. Performed at [facility], [address] [place], [place] [address]    Sodium 07/09/2023 139  135 - 145 mmol/L Final   Potassium 07/09/2023 3.2 (L)  3.5 - 5.1 mmol/L Final   Chloride 07/09/2023 103  98 - 111 mmol/L Final   CO2  07/09/2023 26  22 - 32 mmol/L Final   Glucose, Bld 07/09/2023 88  70 - 99 mg/dL Final   Glucose reference range applies only to samples taken after fasting for at least 8 hours.   BUN 07/09/2023 20  8 - 23 mg/dL Final   Creatinine, Ser 07/09/2023 0.97  0.44 - 1.00 mg/dL Final   Calcium 09/81/1914 9.3  8.9 - 10.3 mg/dL Final   GFR, Estimated 07/09/2023 57 (L)  >60 mL/min Final   Comment: (NOTE) Calculated using the CKD-EPI Creatinine Equation (2021)    Anion gap 07/09/2023 10  5 - 15 Final   Performed at [facility], [address] [place], [place] [address]   WBC 07/09/2023 7.9  4.0 - 10.5 K/uL Final   RBC 07/09/2023 4.75  3.87 - 5.11 MIL/uL Final   Hemoglobin 07/09/2023 14.1  12.0 - 15.0 g/dL Final   HCT 62/13/0865 43.7  36.0 - 46.0 % Final   MCV 07/09/2023 92.0  80.0 - 100.0 fL Final   MCH 07/09/2023 29.7  26.0 - 34.0 pg Final   MCHC 07/09/2023 32.3  30.0 - 36.0 g/dL Final   RDW 78/46/9629 13.9  11.5 - 15.5 % Final   Platelets 07/09/2023 266  150 - 400 K/uL Final   nRBC 07/09/2023 0.0  0.0 - 0.2 % Final   Performed at [facility], [address], [place], [place] [address]   ABO/RH(D) 07/09/2023 O POS   Final   Antibody Screen 07/09/2023 NEG   Final   Sample Expiration 07/09/2023 07/20/2023,2359   Final   Extend sample reason 07/09/2023    Final                   Value:NO TRANSFUSIONS OR PREGNANCY IN THE PAST 3 MONTHS Performed at [facility], [address], [place], [place] [address]   Office Visit on 07/01/2023  Component Date Value Ref Range Status   Vit D, 25-Hydroxy 07/01/2023 45  30 - 100 ng/mL Final   Comment: Vitamin D Status         25-OH Vitamin D: . Deficiency:                    <20 ng/mL Insufficiency:             20 - 29 ng/mL Optimal:                 > or = 30 ng/mL . For 25-OH  Vitamin D testing on patients on  D2-supplementation and patients for whom quantitation  of D2 and D3 fractions is  required, the QuestAssureD(TM) 25-OH VIT D, (D2,D3), LC/MS/MS is recommended: order  code 40981 (patients >28yrs). . See Note 1 . Note 1 . For additional information, please refer to  http://education.QuestDiagnostics.com/faq/FAQ199  (This link is being provided for informational/ educational purposes only.)      X-Rays:DG Pelvis Portable Result Date: 07/17/2023 CLINICAL DATA:  Status post left hip arthroplasty. EXAM: PORTABLE PELVIS 1-2 VIEWS COMPARISON:  None Available. FINDINGS: Left hip arthroplasty in expected alignment. No periprosthetic lucency or fracture. Recent postsurgical change includes air and edema in the soft tissues. Previous right hip arthroplasty. IMPRESSION: Left hip arthroplasty without immediate postoperative complication. Electronically Signed   By: Narda Rutherford M.D.   On: 07/17/2023 12:41   DG HIP UNILAT WITH PELVIS 1V LEFT Result Date: 07/17/2023 CLINICAL DATA:  Elective surgery. EXAM: DG HIP (WITH OR WITHOUT PELVIS) 1V*L* COMPARISON:  None Available. FINDINGS: Nine fluoroscopic spot views of the pelvis and left hip obtained in the operating room. Sequential images during hip arthroplasty. Fluoroscopy time 6 seconds. Dose 0.67 mGy. IMPRESSION: Procedural fluoroscopy during left hip arthroplasty. Electronically Signed   By: Narda Rutherford M.D.   On: 07/17/2023 12:39   DG C-Arm 1-60 Min-No Report Result Date: 07/17/2023 Fluoroscopy was utilized by the requesting physician.  No radiographic interpretation.    EKG: Orders placed or performed during the hospital encounter of 07/09/23   EKG 12 lead per protocol   EKG 12 lead per protocol     Hospital Course: Desiree Caldwell is a 89 y.o. who was admitted to [facility]. They were brought to the operating room on 07/17/2023 and underwent Procedure(s): ARTHROPLASTY, HIP, TOTAL, ANTERIOR APPROACH.  Patient tolerated the procedure well and was later transferred to the recovery room and then to the orthopaedic  floor for postoperative care. They were given PO and IV analgesics for pain control following their surgery. They were given 24 hours of postoperative antibiotics of  Anti-infectives (From admission, onward)    Start     Dose/Rate Route Frequency Ordered Stop   07/17/23 0730  ceFAZolin (ANCEF) IVPB 2g/100 mL premix        2 g 200 mL/hr over 30 Minutes Intravenous On call to O.R. 07/17/23 1914 07/17/23 1004     and started on DVT prophylaxis in the form of Aspirin.   PT and OT were ordered for total joint protocol. Discharge planning consulted to help with post-op disposition and equipment needs. Patient had a fair night on the evening of surgery. They started to get up OOB with physical therapy on POD #0. Continued to work with physical therapy into POD #2. Patient was seen during rounds on day two and was ready to go home pending progress with physical therapy. Patient worked with physical therapy for a total of 4 sessions and was meeting their goals. Dressing was changed and the incision was C/D/I.  They were discharged home later that day in stable condition.  Diet: Regular diet Activity: WBAT Follow-up: in 2 weeks Disposition: Home Discharged Condition: stable   Discharge Instructions     Call MD / Call 911   Complete by: As directed    If you experience chest pain or shortness of breath, CALL 911 and be transported to the hospital emergency room.  If you develope a fever above 101 F, pus (white drainage) or increased drainage or redness at the wound,  or calf pain, call your surgeon's office.   Change dressing   Complete by: As directed    You have an adhesive waterproof bandage over the incision. Leave this in place until your first follow-up appointment. Once you remove this you will not need to place another bandage.   Constipation Prevention   Complete by: As directed    Drink plenty of fluids.  Prune juice may be helpful.  You may use a stool softener, such as Colace (over the  counter) 100 mg twice a day.  Use MiraLax (over the counter) for constipation as needed.   Diet - low sodium heart healthy   Complete by: As directed    Do not sit on low chairs, stoools or toilet seats, as it may be difficult to get up from low surfaces   Complete by: As directed    Driving restrictions   Complete by: As directed    No driving for two weeks   Post-operative opioid taper instructions:   Complete by: As directed    POST-OPERATIVE OPIOID TAPER INSTRUCTIONS: It is important to wean off of your opioid medication as soon as possible. If you do not need pain medication after your surgery it is ok to stop day one. Opioids include: Codeine, Hydrocodone(Norco, Vicodin), Oxycodone(Percocet, oxycontin) and hydromorphone amongst others.  Long term and even short term use of opiods can cause: Increased pain response Dependence Constipation Depression Respiratory depression And more.  Withdrawal symptoms can include Flu like symptoms Nausea, vomiting And more Techniques to manage these symptoms Hydrate well Eat regular healthy meals Stay active Use relaxation techniques(deep breathing, meditating, yoga) Do Not substitute Alcohol to help with tapering If you have been on opioids for less than two weeks and do not have pain than it is ok to stop all together.  Plan to wean off of opioids This plan should start within one week post op of your joint replacement. Maintain the same interval or time between taking each dose and first decrease the dose.  Cut the total daily intake of opioids by one tablet each day Next start to increase the time between doses. The last dose that should be eliminated is the evening dose.      TED hose   Complete by: As directed    Use stockings (TED hose) for three weeks on both leg(s).  You may remove them at night for sleeping.   Weight bearing as tolerated   Complete by: As directed       Allergies as of 07/19/2023   No Known Allergies       Medication List     STOP taking these medications    aspirin EC 81 MG tablet Replaced by: Aspirin Low Dose 81 MG chewable tablet       TAKE these medications    Aspirin Low Dose 81 MG chewable tablet Generic drug: aspirin Chew 1 tablet (81 mg total) by mouth 2 (two) times daily for 20 days. Then resume an 81 mg aspirin once a day. Replaces: aspirin EC 81 MG tablet   docusate sodium 100 MG capsule Commonly known as: COLACE Take 100 mg by mouth at bedtime.   Dry Eye Relief Drops 0.2-0.2-1 % Soln Generic drug: Glycerin-Hypromellose-PEG 400 Place 1 drop into both eyes daily.   fluticasone 50 MCG/ACT nasal spray Commonly known as: FLONASE Place 2 sprays into both nostrils every morning.   hydrochlorothiazide 25 MG tablet Commonly known as: HYDRODIURIL Take 12.5 mg by mouth every morning.  HYDROcodone-acetaminophen 5-325 MG tablet Commonly known as: NORCO/VICODIN Take 1-2 tablets by mouth every 6 (six) hours as needed for severe pain (pain score 7-10).   methocarbamol 500 MG tablet Commonly known as: ROBAXIN Take 1 tablet (500 mg total) by mouth every 6 (six) hours as needed for muscle spasms.   omeprazole 20 MG capsule Commonly known as: PRILOSEC Take 20 mg by mouth every morning.   ondansetron 4 MG tablet Commonly known as: ZOFRAN Take 1 tablet (4 mg total) by mouth every 6 (six) hours as needed for nausea.   simvastatin 80 MG tablet Commonly known as: ZOCOR Take 80 mg by mouth at bedtime.   traMADol 50 MG tablet Commonly known as: ULTRAM Take 1-2 tablets (50-100 mg total) by mouth every 6 (six) hours as needed for moderate pain (pain score 4-6).               Discharge Care Instructions  (From admission, onward)           Start     Ordered   07/18/23 0000  Weight bearing as tolerated        07/18/23 0731   07/18/23 0000  Change dressing       Comments: You have an adhesive waterproof bandage over the incision. Leave this in place until  your first follow-up appointment. Once you remove this you will not need to place another bandage.   07/18/23 0731            Follow-up Information     Ollen Gross, MD. Go on 07/30/2023.   Specialty: Orthopedic Surgery Why: You are scheduled for a follow up appointment on 07-30-23 at 4:00 pm. Contact information: [address] [place] [address] [phone]                 Signed: R. Arcola Jansky, PA-C Orthopedic Surgery 07/22/2023, 7:50 AM

## 2023-08-21 DIAGNOSIS — Z5189 Encounter for other specified aftercare: Secondary | ICD-10-CM | POA: Diagnosis not present

## 2023-09-03 DIAGNOSIS — I129 Hypertensive chronic kidney disease with stage 1 through stage 4 chronic kidney disease, or unspecified chronic kidney disease: Secondary | ICD-10-CM | POA: Diagnosis not present

## 2023-09-03 DIAGNOSIS — E782 Mixed hyperlipidemia: Secondary | ICD-10-CM | POA: Diagnosis not present

## 2023-09-03 DIAGNOSIS — R7303 Prediabetes: Secondary | ICD-10-CM | POA: Diagnosis not present

## 2023-09-03 DIAGNOSIS — Z96642 Presence of left artificial hip joint: Secondary | ICD-10-CM | POA: Diagnosis not present

## 2023-09-03 DIAGNOSIS — N183 Chronic kidney disease, stage 3 unspecified: Secondary | ICD-10-CM | POA: Diagnosis not present

## 2023-10-08 NOTE — Telephone Encounter (Signed)
 Prolia  VOB initiated via [email or web address]  Next Prolia  inj DUE: 10/24/23

## 2023-10-15 NOTE — Telephone Encounter (Signed)
 Medical Buy and Bill  Prior Authorization REQUIRED for PROLIA    PA PROCESS DETAILS: PA is required. PA can be initiated by calling [phone] or online at  [email or web address]

## 2023-10-18 NOTE — Telephone Encounter (Signed)
 Prior Authorization initiated for PROLIA  via [email or web address] KEY: [id number]

## 2023-10-22 NOTE — Telephone Encounter (Addendum)
 Prior Authorization for PROLIA  APPROVED PA# [id number] Valid: 09/10/19-05/13/24

## 2023-10-25 ENCOUNTER — Ambulatory Visit

## 2023-10-25 DIAGNOSIS — M810 Age-related osteoporosis without current pathological fracture: Secondary | ICD-10-CM | POA: Diagnosis not present

## 2023-10-25 MED ORDER — DENOSUMAB 60 MG/ML ~~LOC~~ SOSY
60.0000 mg | PREFILLED_SYRINGE | Freq: Once | SUBCUTANEOUS | Status: AC
  Administered 2024-05-15: 60 mg via SUBCUTANEOUS

## 2023-10-25 NOTE — Progress Notes (Signed)
 Medication: Prolia  Dosage:60 mg Location: R arm Injection given WE:[id number] Provider: Linford Ribas

## 2023-10-28 ENCOUNTER — Telehealth: Payer: Self-pay

## 2023-10-28 NOTE — Telephone Encounter (Signed)
 This patient came in for a Prolia  injection on Friday but the last PA on File exp in December 2024.

## 2023-10-30 NOTE — Telephone Encounter (Signed)
 Medical Buy and Raenette Bumps  Patient is ready for scheduling on or after 10/24/23  Out-of-pocket cost due at time of visit: $0  Primary: [employer] [place] [employer] Prolia  co-insurance: $40 Admin fee co-insurance: n/a  Deductible: does not apply  Prior Auth: APPROVED PA# [id number] Valid: 09/10/19-05/13/24  Secondary: N/A Prolia  co-insurance:  Admin fee co-insurance:  Deductible:  Prior Auth:  PA# Valid:   ** This summary of benefits is an estimation of the patient's out-of-pocket cost. Exact cost may vary based on individual plan coverage.

## 2023-10-30 NOTE — Telephone Encounter (Signed)
 Last Prolia  inj 10/25/23 Next Prolia  inj due 04/26/24

## 2023-12-31 DIAGNOSIS — Z85828 Personal history of other malignant neoplasm of skin: Secondary | ICD-10-CM | POA: Diagnosis not present

## 2023-12-31 DIAGNOSIS — L578 Other skin changes due to chronic exposure to nonionizing radiation: Secondary | ICD-10-CM | POA: Diagnosis not present

## 2023-12-31 DIAGNOSIS — L814 Other melanin hyperpigmentation: Secondary | ICD-10-CM | POA: Diagnosis not present

## 2023-12-31 DIAGNOSIS — L821 Other seborrheic keratosis: Secondary | ICD-10-CM | POA: Diagnosis not present

## 2023-12-31 DIAGNOSIS — D225 Melanocytic nevi of trunk: Secondary | ICD-10-CM | POA: Diagnosis not present

## 2023-12-31 DIAGNOSIS — L570 Actinic keratosis: Secondary | ICD-10-CM | POA: Diagnosis not present

## 2024-02-17 DIAGNOSIS — D485 Neoplasm of uncertain behavior of skin: Secondary | ICD-10-CM | POA: Diagnosis not present

## 2024-02-17 DIAGNOSIS — D0439 Carcinoma in situ of skin of other parts of face: Secondary | ICD-10-CM | POA: Diagnosis not present

## 2024-02-26 DIAGNOSIS — R7303 Prediabetes: Secondary | ICD-10-CM | POA: Diagnosis not present

## 2024-02-26 DIAGNOSIS — Z0000 Encounter for general adult medical examination without abnormal findings: Secondary | ICD-10-CM | POA: Diagnosis not present

## 2024-02-26 DIAGNOSIS — N183 Chronic kidney disease, stage 3 unspecified: Secondary | ICD-10-CM | POA: Diagnosis not present

## 2024-02-26 DIAGNOSIS — M810 Age-related osteoporosis without current pathological fracture: Secondary | ICD-10-CM | POA: Diagnosis not present

## 2024-02-26 DIAGNOSIS — K219 Gastro-esophageal reflux disease without esophagitis: Secondary | ICD-10-CM | POA: Diagnosis not present

## 2024-02-26 DIAGNOSIS — Z1331 Encounter for screening for depression: Secondary | ICD-10-CM | POA: Diagnosis not present

## 2024-02-26 DIAGNOSIS — J301 Allergic rhinitis due to pollen: Secondary | ICD-10-CM | POA: Diagnosis not present

## 2024-02-26 DIAGNOSIS — E782 Mixed hyperlipidemia: Secondary | ICD-10-CM | POA: Diagnosis not present

## 2024-02-26 DIAGNOSIS — I129 Hypertensive chronic kidney disease with stage 1 through stage 4 chronic kidney disease, or unspecified chronic kidney disease: Secondary | ICD-10-CM | POA: Diagnosis not present

## 2024-02-26 LAB — LAB REPORT - SCANNED
A1c: 5.9
Albumin, Urine POC: 0.7
Albumin/Creatinine Ratio, Urine, POC: 5.1
Creatinine, POC: 138 mg/dL
EGFR: 57

## 2024-03-12 DIAGNOSIS — C44329 Squamous cell carcinoma of skin of other parts of face: Secondary | ICD-10-CM | POA: Diagnosis not present

## 2024-03-20 ENCOUNTER — Other Ambulatory Visit: Payer: Self-pay | Admitting: Family Medicine

## 2024-03-20 DIAGNOSIS — H18593 Other hereditary corneal dystrophies, bilateral: Secondary | ICD-10-CM | POA: Diagnosis not present

## 2024-03-20 DIAGNOSIS — H04123 Dry eye syndrome of bilateral lacrimal glands: Secondary | ICD-10-CM | POA: Diagnosis not present

## 2024-03-20 DIAGNOSIS — H02831 Dermatochalasis of right upper eyelid: Secondary | ICD-10-CM | POA: Diagnosis not present

## 2024-03-20 DIAGNOSIS — H43813 Vitreous degeneration, bilateral: Secondary | ICD-10-CM | POA: Diagnosis not present

## 2024-03-20 DIAGNOSIS — Z961 Presence of intraocular lens: Secondary | ICD-10-CM | POA: Diagnosis not present

## 2024-03-20 DIAGNOSIS — Z1231 Encounter for screening mammogram for malignant neoplasm of breast: Secondary | ICD-10-CM

## 2024-03-28 DIAGNOSIS — M25551 Pain in right hip: Secondary | ICD-10-CM | POA: Diagnosis not present

## 2024-03-28 DIAGNOSIS — Z96641 Presence of right artificial hip joint: Secondary | ICD-10-CM | POA: Diagnosis not present

## 2024-03-28 DIAGNOSIS — M25561 Pain in right knee: Secondary | ICD-10-CM | POA: Diagnosis not present

## 2024-03-28 DIAGNOSIS — M1711 Unilateral primary osteoarthritis, right knee: Secondary | ICD-10-CM | POA: Diagnosis not present

## 2024-03-30 NOTE — Telephone Encounter (Signed)
 Prolia  VOB initiated via [email or web address]  Next Prolia  inj DUE: 04/26/24

## 2024-03-31 DIAGNOSIS — M1711 Unilateral primary osteoarthritis, right knee: Secondary | ICD-10-CM | POA: Diagnosis not present

## 2024-03-31 DIAGNOSIS — Z96641 Presence of right artificial hip joint: Secondary | ICD-10-CM | POA: Diagnosis not present

## 2024-04-12 NOTE — Telephone Encounter (Signed)
 Medical Buy and Annette Stable - Prior Authorization REQUIRED for [employer]

## 2024-04-19 NOTE — Telephone Encounter (Signed)
 Prior Authorization initiated for PROLIA  via [email or web address] KEY: [id number]

## 2024-04-21 NOTE — Telephone Encounter (Signed)
 Medical Buy and [employer]  Prior Authorization for PROLIA  APPROVED PA# [id number] Valid: 09/07/19-05/13/25

## 2024-04-23 ENCOUNTER — Ambulatory Visit
Admission: RE | Admit: 2024-04-23 | Discharge: 2024-04-23 | Disposition: A | Discharge status: Home / Self Care | Source: Ambulatory Visit | Attending: Family Medicine | Admitting: Family Medicine

## 2024-04-23 DIAGNOSIS — Z1231 Encounter for screening mammogram for malignant neoplasm of breast: Secondary | ICD-10-CM

## 2024-04-26 NOTE — Telephone Encounter (Signed)
 [facility] [employer] [facility] [employer]  Patient is ready for scheduling on or after 04/26/24  Out-of-pocket cost due at time of visit: $40  Primary: [employer] [place] [employer] Prolia  co-insurance: 0% Admin fee co-insurance: $40  Deductible: does not apply  Prior Auth: APPROVED PA# [id number] Valid: 09/07/19-05/13/25  Secondary: N/A Prolia  co-insurance:  Admin fee co-insurance:  Deductible:  Prior Auth:  PA# Valid:   ** This summary of benefits is an estimation of the patient's out-of-pocket cost. Exact cost may vary based on individual plan coverage.

## 2024-04-27 NOTE — Telephone Encounter (Signed)
 Benefits may vary. [employer] will not process benefits between 05/14/24-05/22/24.   VOB resubmitted for 05/24/23.

## 2024-04-27 NOTE — Telephone Encounter (Signed)
 LMx1 to schedule Prolia injection.

## 2024-05-15 ENCOUNTER — Ambulatory Visit

## 2024-05-15 VITALS — BP 132/70 | HR 85 | Resp 16

## 2024-05-15 DIAGNOSIS — M810 Age-related osteoporosis without current pathological fracture: Secondary | ICD-10-CM | POA: Diagnosis not present

## 2024-05-15 MED ORDER — DENOSUMAB 60 MG/ML ~~LOC~~ SOSY: 60.0000 mg | PREFILLED_SYRINGE | Freq: Once | SUBCUTANEOUS | Status: AC

## 2024-05-15 NOTE — Progress Notes (Signed)
 After obtaining consent, and per orders of Dr. Elvera Lennox, injection of Prolia given by Tera Partridge. Patient instructed to remain in clinic for 20 minutes afterwards, and to report any adverse reaction to me immediately.

## 2024-05-21 NOTE — Telephone Encounter (Signed)
 Last Prolia  inj 05/15/24 Next Prolia  inj due 11/13/24

## 2024-06-30 ENCOUNTER — Ambulatory Visit: Payer: Medicare PPO | Admitting: Internal Medicine
